# Patient Record
Sex: Female | Born: 1965 | Race: White | Hispanic: No | Marital: Married | Smoking: Never smoker
Health system: Southern US, Community
[De-identification: ages and names within clinical notes are randomized; demographics above are authoritative.]

## PROBLEM LIST (undated history)

## (undated) DIAGNOSIS — I8390 Asymptomatic varicose veins of unspecified lower extremity: Secondary | ICD-10-CM

## (undated) DIAGNOSIS — Z889 Allergy status to unspecified drugs, medicaments and biological substances status: Secondary | ICD-10-CM

## (undated) DIAGNOSIS — IMO0002 Reserved for concepts with insufficient information to code with codable children: Secondary | ICD-10-CM

## (undated) DIAGNOSIS — Z9189 Other specified personal risk factors, not elsewhere classified: Secondary | ICD-10-CM

## (undated) DIAGNOSIS — C50919 Malignant neoplasm of unspecified site of unspecified female breast: Secondary | ICD-10-CM

## (undated) HISTORY — DX: Other specified personal risk factors, not elsewhere classified: Z91.89

## (undated) HISTORY — PX: OTHER SURGICAL HISTORY: SHX169

## (undated) HISTORY — DX: Asymptomatic varicose veins of unspecified lower extremity: I83.90

## (undated) HISTORY — DX: Reserved for concepts with insufficient information to code with codable children: IMO0002

## (undated) HISTORY — DX: Malignant neoplasm of unspecified site of unspecified female breast: C50.919

## (undated) HISTORY — PX: BREAST SURGERY: SHX581

---

## 2001-05-15 ENCOUNTER — Other Ambulatory Visit: Admission: RE | Admit: 2001-05-15 | Discharge: 2001-05-15 | Payer: Self-pay | Admitting: Obstetrics and Gynecology

## 2003-02-22 ENCOUNTER — Other Ambulatory Visit: Admission: RE | Admit: 2003-02-22 | Discharge: 2003-02-22 | Payer: Self-pay | Admitting: Obstetrics and Gynecology

## 2004-08-27 ENCOUNTER — Other Ambulatory Visit: Admission: RE | Admit: 2004-08-27 | Discharge: 2004-08-27 | Payer: Self-pay | Admitting: Obstetrics and Gynecology

## 2005-12-05 ENCOUNTER — Other Ambulatory Visit: Admission: RE | Admit: 2005-12-05 | Discharge: 2005-12-05 | Payer: Self-pay | Admitting: Obstetrics and Gynecology

## 2007-03-10 ENCOUNTER — Other Ambulatory Visit: Admission: RE | Admit: 2007-03-10 | Discharge: 2007-03-10 | Payer: Self-pay | Admitting: Obstetrics and Gynecology

## 2008-03-24 ENCOUNTER — Ambulatory Visit: Payer: Self-pay | Admitting: Obstetrics and Gynecology

## 2008-03-24 ENCOUNTER — Other Ambulatory Visit: Admission: RE | Admit: 2008-03-24 | Discharge: 2008-03-24 | Payer: Self-pay | Admitting: Obstetrics and Gynecology

## 2008-03-24 ENCOUNTER — Encounter: Payer: Self-pay | Admitting: Obstetrics and Gynecology

## 2008-04-15 ENCOUNTER — Ambulatory Visit: Payer: Self-pay | Admitting: Obstetrics and Gynecology

## 2008-06-07 ENCOUNTER — Ambulatory Visit: Payer: Self-pay | Admitting: Obstetrics and Gynecology

## 2008-06-16 ENCOUNTER — Ambulatory Visit (HOSPITAL_COMMUNITY): Admission: RE | Admit: 2008-06-16 | Discharge: 2008-06-16 | Payer: Self-pay | Admitting: Obstetrics and Gynecology

## 2008-06-16 ENCOUNTER — Ambulatory Visit: Payer: Self-pay | Admitting: Obstetrics and Gynecology

## 2009-06-08 ENCOUNTER — Ambulatory Visit: Payer: Self-pay | Admitting: Obstetrics and Gynecology

## 2009-06-08 ENCOUNTER — Other Ambulatory Visit: Admission: RE | Admit: 2009-06-08 | Discharge: 2009-06-08 | Payer: Self-pay | Admitting: Obstetrics and Gynecology

## 2010-07-25 ENCOUNTER — Other Ambulatory Visit
Admission: RE | Admit: 2010-07-25 | Discharge: 2010-07-25 | Payer: Self-pay | Source: Home / Self Care | Admitting: Obstetrics and Gynecology

## 2010-07-25 ENCOUNTER — Other Ambulatory Visit: Payer: Self-pay | Admitting: Obstetrics and Gynecology

## 2010-07-25 ENCOUNTER — Ambulatory Visit
Admission: RE | Admit: 2010-07-25 | Discharge: 2010-07-25 | Payer: Self-pay | Source: Home / Self Care | Attending: Obstetrics and Gynecology | Admitting: Obstetrics and Gynecology

## 2010-11-15 ENCOUNTER — Other Ambulatory Visit (INDEPENDENT_AMBULATORY_CARE_PROVIDER_SITE_OTHER): Payer: BC Managed Care – PPO

## 2010-11-15 DIAGNOSIS — D649 Anemia, unspecified: Secondary | ICD-10-CM

## 2011-01-21 ENCOUNTER — Encounter: Payer: Self-pay | Admitting: Obstetrics and Gynecology

## 2011-01-21 ENCOUNTER — Ambulatory Visit: Payer: BC Managed Care – PPO | Admitting: Obstetrics and Gynecology

## 2011-01-21 ENCOUNTER — Other Ambulatory Visit: Payer: Self-pay

## 2011-01-21 ENCOUNTER — Ambulatory Visit (INDEPENDENT_AMBULATORY_CARE_PROVIDER_SITE_OTHER): Payer: BC Managed Care – PPO | Admitting: Obstetrics and Gynecology

## 2011-01-21 DIAGNOSIS — N920 Excessive and frequent menstruation with regular cycle: Secondary | ICD-10-CM

## 2011-01-21 DIAGNOSIS — N852 Hypertrophy of uterus: Secondary | ICD-10-CM

## 2011-01-21 DIAGNOSIS — D259 Leiomyoma of uterus, unspecified: Secondary | ICD-10-CM

## 2011-01-21 DIAGNOSIS — D251 Intramural leiomyoma of uterus: Secondary | ICD-10-CM

## 2011-01-21 NOTE — Progress Notes (Signed)
The patient came in today for followup ultrasound her fibroids continued to grow. On ultrasound today the her myoma is as large as 12.5 cm. Last visit it was only 11 cm. The the endometrial echo is 5.4 mm. Both her ovaries can be visualized in spite of the large fibroids and she has no ovarian pathology. Her cul-de-sac does have some fluid. The patient now is ready to address her myomas. We discussed medication such as Depo-Lupron Lupron or the day after pill. We discussed ultrasound obstruction or uterine artery embolization. The fibroids calm to the level of her umbilicus I don't believe minimally invasive surgery is possible. We discussed proceeding with abdominal hysterectomy and the patient is agreeable. She will call me with dates.

## 2011-01-22 ENCOUNTER — Encounter: Payer: Self-pay | Admitting: Obstetrics and Gynecology

## 2011-01-29 ENCOUNTER — Telehealth: Payer: Self-pay

## 2011-01-29 NOTE — Telephone Encounter (Signed)
Patient is ready to schedule surgery. We talked about dates and she was hoping to do it next week.  The only date that would be possible with Dr. Reece Agar schedule is 8/7 and I checked with OR and there is no time available. Dr Reece Agar out of office 8/15-8/21 so I told patient we should look toward end of August.  Patient was on lunch hour and will go back to work and look at schedule and call me again this pm.  Meantime, could you give me procedure info.  I am sending traditional surgery slip to you on her chart if you want to just indicate it there.  Thanks.

## 2011-01-29 NOTE — Telephone Encounter (Signed)
Pt Called back after checking her calendar and I scheduled her for Aug 27. 2012 at 7:30am at [facility].  Preop consult scheduled for 8/23 with Dr. Reece Agar.  Dr G notified of date so that he can do his order sets.

## 2011-01-29 NOTE — Telephone Encounter (Signed)
Please schedule TAH. Diagnosis is fibroids.

## 2011-02-20 NOTE — H&P (Signed)
  Chief complaint: Symptomatic fibroids.  History of present illness: Patient is a 45 year old nulligravida who we have watched with fibroids for an extended period of time. Over the past year to 2 years they have continued to grow. They are now up to the patient's umbilicus. On ultrasound the largest one is 12-1/2 cm. The patient is now very symptomatic from them. This includes anemia requiring iron replacement due to heavy periods. The patient is having significant pressure on both her bladder and her rectum. She is comfortable with the decision not to have any  Children. For a while her periods could be controlled with an IUD. However they are now not controlled at all and she has menorrhagia and dysmenorrhea. The fibroids are too large to require minimally invasive surgery. The patient therefore will undergo a total abdominal hysterectomy. We will preserve  Her ovarys unless there is disease. On ultrasound her ovaries appear normal.  Past medical history: Previous surgery for varicose veins in 2000 and 2001. No significant medical illnesses. Patient does have allergies and she takes Benadryl Sudafed for the above. Medications: Sudafed, Benadryl, ibuprofen, and iron. Allergies: No known drug allergies.  Social history: Unremarkable.  Family history: Maternal grandmother had uterine cancer. Mother and father hypertensive. Maternal aunt had breast cancer.  Review of systems: HEENT negative. Cardiac negative. Respiratory negative. GU negative except as noted above. Endocrine negative. Musculoskeletal negative.  Physical exam: Blood pressure 116/72, pulse 80, respirations 16. Physical examination: HEENT within normal limits. Neck: Thyroid not large. No masses. Supraclavicular nodes: not enlarged. Breasts: Examined in both sitting midline position. No skin changes and no masses. Abdomen: Soft no guarding rebound or masses or hernia. Except for large uterus palpable to the umbilicus. Pelvic: External:  Within normal limits. BUS: Within normal limits. Vaginal:within normal limits. Good estrogen effect. No evidence of cystocele rectocele or enterocele. Cervix: clean. Uterus: enlarged 20 week size. Adnexa: Not palpable due to large size of uterus. Rectovaginal exam: Confirmatory and negative. Extremities: Within normal limits.  Assessment: Symptomatic fibroids.  Plan: Exploratory laparotomy with removal of the uterus.

## 2011-02-21 ENCOUNTER — Encounter (HOSPITAL_COMMUNITY): Payer: Self-pay

## 2011-02-21 ENCOUNTER — Encounter (HOSPITAL_COMMUNITY)
Admission: RE | Admit: 2011-02-21 | Discharge: 2011-02-21 | Disposition: A | Discharge status: Home / Self Care | Payer: BC Managed Care – PPO | Source: Ambulatory Visit | Attending: Obstetrics and Gynecology | Admitting: Obstetrics and Gynecology

## 2011-02-21 ENCOUNTER — Ambulatory Visit (INDEPENDENT_AMBULATORY_CARE_PROVIDER_SITE_OTHER): Payer: BC Managed Care – PPO | Admitting: Obstetrics and Gynecology

## 2011-02-21 ENCOUNTER — Encounter: Payer: Self-pay | Admitting: Obstetrics and Gynecology

## 2011-02-21 DIAGNOSIS — D259 Leiomyoma of uterus, unspecified: Secondary | ICD-10-CM

## 2011-02-21 DIAGNOSIS — N920 Excessive and frequent menstruation with regular cycle: Secondary | ICD-10-CM

## 2011-02-21 DIAGNOSIS — D219 Benign neoplasm of connective and other soft tissue, unspecified: Secondary | ICD-10-CM | POA: Insufficient documentation

## 2011-02-21 LAB — CBC
Hemoglobin: 10.2 g/dL — ABNORMAL LOW (ref 12.0–15.0)
MCH: 26.5 pg (ref 26.0–34.0)
MCHC: 31 g/dL (ref 30.0–36.0)
MCV: 85.5 fL (ref 78.0–100.0)
RBC: 3.85 MIL/uL — ABNORMAL LOW (ref 3.87–5.11)

## 2011-02-21 NOTE — Progress Notes (Signed)
The patient came today to discuss her fibroids. We are going to proceed with her surgery next week. We will remove her uterus but not her ovaries unless she has significant disease. She will stop all nonsteroidal drugs now. We discussed normal recuperation convalescence. We discussed her stay in the hospital. We discussed risks of surgery including infection, hemorrhage, wound dehiscence, DVT, and fistula formation. All her questions were answered. We will use a low transverse incision. We will use PCA morphine.

## 2011-02-21 NOTE — Patient Instructions (Signed)
20 RHYANNA SORCE  02/21/2011   Your procedure is scheduled on:  02/25/11  Report to [facility] at 0600 AM.  Call this number if you have problems the morning of surgery: [phone]   Remember:   Do not eat food:After Midnight.  Do not drink clear liquids: After Midnight.  Take these medicines the morning of surgery with A SIP OF WATER: none   Do not wear jewelry, make-up or nail polish.  Do not wear lotions, powders, or perfumes. You may wear deodorant.  Do not shave 48 hours prior to surgery.  Do not bring valuables to the hospital.  Contacts, dentures or bridgework may not be worn into surgery.  Leave suitcase in the car. After surgery it may be brought to your room.  For patients admitted to the hospital, checkout time is 11:00 AM the day of discharge.   Patients discharged the day of surgery will not be allowed to drive home.  Name and phone number of your driver: mother-Hildagard Prajapati- [phone]  Special Instructions: CHG Shower Use Special Wash: 1/2 bottle night before surgery and 1/2 bottle morning of surgery.   Please read over the following fact sheets that you were given: none

## 2011-02-25 ENCOUNTER — Encounter (HOSPITAL_COMMUNITY)
Admission: RE | Disposition: A | Discharge status: Home / Self Care | Payer: Self-pay | Source: Ambulatory Visit | Attending: Obstetrics and Gynecology

## 2011-02-25 ENCOUNTER — Encounter (HOSPITAL_COMMUNITY): Payer: Self-pay | Admitting: Anesthesiology

## 2011-02-25 ENCOUNTER — Other Ambulatory Visit: Payer: Self-pay | Admitting: Obstetrics and Gynecology

## 2011-02-25 ENCOUNTER — Inpatient Hospital Stay (HOSPITAL_COMMUNITY): Payer: BC Managed Care – PPO | Admitting: Anesthesiology

## 2011-02-25 ENCOUNTER — Inpatient Hospital Stay (HOSPITAL_COMMUNITY)
Admission: RE | Admit: 2011-02-25 | Discharge: 2011-02-27 | DRG: 358 | Disposition: A | Discharge status: Home / Self Care | Payer: BC Managed Care – PPO | Source: Ambulatory Visit | Attending: Obstetrics and Gynecology | Admitting: Obstetrics and Gynecology

## 2011-02-25 ENCOUNTER — Encounter (HOSPITAL_COMMUNITY): Payer: Self-pay | Admitting: *Deleted

## 2011-02-25 DIAGNOSIS — N8000 Endometriosis of the uterus, unspecified: Secondary | ICD-10-CM | POA: Diagnosis present

## 2011-02-25 DIAGNOSIS — N920 Excessive and frequent menstruation with regular cycle: Secondary | ICD-10-CM

## 2011-02-25 DIAGNOSIS — K929 Disease of digestive system, unspecified: Secondary | ICD-10-CM | POA: Diagnosis not present

## 2011-02-25 DIAGNOSIS — N946 Dysmenorrhea, unspecified: Secondary | ICD-10-CM | POA: Diagnosis present

## 2011-02-25 DIAGNOSIS — D251 Intramural leiomyoma of uterus: Secondary | ICD-10-CM | POA: Diagnosis present

## 2011-02-25 DIAGNOSIS — D259 Leiomyoma of uterus, unspecified: Secondary | ICD-10-CM

## 2011-02-25 DIAGNOSIS — D500 Iron deficiency anemia secondary to blood loss (chronic): Secondary | ICD-10-CM | POA: Diagnosis present

## 2011-02-25 DIAGNOSIS — K560 Paralytic ileus: Secondary | ICD-10-CM | POA: Diagnosis not present

## 2011-02-25 HISTORY — PX: ABDOMINAL HYSTERECTOMY: SHX81

## 2011-02-25 SURGERY — HYSTERECTOMY, ABDOMINAL
Anesthesia: General | Site: Abdomen | Wound class: Clean Contaminated

## 2011-02-25 MED ORDER — ROCURONIUM BROMIDE 50 MG/5ML IV SOLN
INTRAVENOUS | Status: AC
  Filled 2011-02-25: qty 1

## 2011-02-25 MED ORDER — KETOROLAC TROMETHAMINE 30 MG/ML IJ SOLN
INTRAMUSCULAR | Status: AC
  Filled 2011-02-25: qty 1

## 2011-02-25 MED ORDER — FENTANYL CITRATE 0.05 MG/ML IJ SOLN
INTRAMUSCULAR | Status: AC
  Filled 2011-02-25: qty 10

## 2011-02-25 MED ORDER — GLYCOPYRROLATE 0.2 MG/ML IJ SOLN
INTRAMUSCULAR | Status: DC | PRN
  Administered 2011-02-25: .4 mg via INTRAVENOUS

## 2011-02-25 MED ORDER — FENTANYL CITRATE 0.05 MG/ML IJ SOLN
INTRAMUSCULAR | Status: AC
  Administered 2011-02-25: 50 ug via INTRAVENOUS
  Filled 2011-02-25: qty 2

## 2011-02-25 MED ORDER — BISACODYL 10 MG RE SUPP
10.0000 mg | Freq: Every day | RECTAL | Status: DC | PRN
  Administered 2011-02-26 (×2): 10 mg via RECTAL
  Filled 2011-02-25 (×2): qty 1

## 2011-02-25 MED ORDER — LACTATED RINGERS IV SOLN
INTRAVENOUS | Status: DC
  Administered 2011-02-25: 06:00:00 via INTRAVENOUS

## 2011-02-25 MED ORDER — ONDANSETRON HCL 4 MG/2ML IJ SOLN
INTRAMUSCULAR | Status: AC
  Filled 2011-02-25: qty 2

## 2011-02-25 MED ORDER — DEXTROSE 5 % IV SOLN
INTRAVENOUS | Status: DC | PRN
  Administered 2011-02-25: 07:00:00 via INTRAVENOUS

## 2011-02-25 MED ORDER — TEMAZEPAM 15 MG PO CAPS: 15.0000 mg | ORAL_CAPSULE | Freq: Every evening | ORAL | Status: DC | PRN

## 2011-02-25 MED ORDER — NEOSTIGMINE METHYLSULFATE 1 MG/ML IJ SOLN
INTRAMUSCULAR | Status: DC | PRN
  Administered 2011-02-25: 3 mg via INTRAMUSCULAR

## 2011-02-25 MED ORDER — LIDOCAINE HCL (CARDIAC) 20 MG/ML IV SOLN
INTRAVENOUS | Status: AC
  Filled 2011-02-25: qty 5

## 2011-02-25 MED ORDER — OXYCODONE-ACETAMINOPHEN 5-325 MG PO TABS
1.0000 | ORAL_TABLET | ORAL | Status: DC | PRN
  Administered 2011-02-26: 2 via ORAL
  Administered 2011-02-26: 1 via ORAL
  Filled 2011-02-25: qty 1
  Filled 2011-02-25: qty 2

## 2011-02-25 MED ORDER — MIDAZOLAM HCL 2 MG/2ML IJ SOLN
INTRAMUSCULAR | Status: AC
  Filled 2011-02-25: qty 4

## 2011-02-25 MED ORDER — KETOROLAC TROMETHAMINE 30 MG/ML IJ SOLN
INTRAMUSCULAR | Status: DC | PRN
  Administered 2011-02-25: 30 mg via INTRAVENOUS

## 2011-02-25 MED ORDER — DIPHENHYDRAMINE HCL 50 MG/ML IJ SOLN: 12.5000 mg | Freq: Four times a day (QID) | INTRAMUSCULAR | Status: DC | PRN

## 2011-02-25 MED ORDER — PROPOFOL 10 MG/ML IV EMUL
INTRAVENOUS | Status: DC | PRN
  Administered 2011-02-25: 120 mg via INTRAVENOUS

## 2011-02-25 MED ORDER — NALOXONE HCL 0.4 MG/ML IJ SOLN: 0.4000 mg | INTRAMUSCULAR | Status: DC | PRN

## 2011-02-25 MED ORDER — FENTANYL CITRATE 0.05 MG/ML IJ SOLN
25.0000 ug | INTRAMUSCULAR | Status: DC | PRN
  Administered 2011-02-25: 25 ug via INTRAVENOUS
  Administered 2011-02-25: 50 ug via INTRAVENOUS

## 2011-02-25 MED ORDER — ONDANSETRON HCL 4 MG/2ML IJ SOLN
4.0000 mg | Freq: Four times a day (QID) | INTRAMUSCULAR | Status: DC | PRN
  Administered 2011-02-25: 4 mg via INTRAVENOUS

## 2011-02-25 MED ORDER — DEXAMETHASONE SODIUM PHOSPHATE 4 MG/ML IJ SOLN
INTRAMUSCULAR | Status: DC | PRN
  Administered 2011-02-25 (×2): 5 mg via INTRAVENOUS

## 2011-02-25 MED ORDER — KETOROLAC TROMETHAMINE 30 MG/ML IJ SOLN: 30.0000 mg | Freq: Three times a day (TID) | INTRAMUSCULAR | Status: DC

## 2011-02-25 MED ORDER — LIDOCAINE HCL (CARDIAC) 20 MG/ML IV SOLN
INTRAVENOUS | Status: DC | PRN
  Administered 2011-02-25: 50 mg via INTRAVENOUS

## 2011-02-25 MED ORDER — MENTHOL 3 MG MT LOZG: 1.0000 | LOZENGE | OROMUCOSAL | Status: DC | PRN

## 2011-02-25 MED ORDER — ONDANSETRON HCL 4 MG/2ML IJ SOLN
4.0000 mg | Freq: Four times a day (QID) | INTRAMUSCULAR | Status: DC | PRN
  Filled 2011-02-25: qty 2

## 2011-02-25 MED ORDER — CEFAZOLIN SODIUM 1-5 GM-% IV SOLN
1.0000 g | INTRAVENOUS | Status: AC
  Administered 2011-02-25: 1 g via INTRAVENOUS

## 2011-02-25 MED ORDER — PROPOFOL 10 MG/ML IV EMUL
INTRAVENOUS | Status: AC
  Filled 2011-02-25: qty 20

## 2011-02-25 MED ORDER — SODIUM CHLORIDE 0.9 % IJ SOLN: 9.0000 mL | INTRAMUSCULAR | Status: DC | PRN

## 2011-02-25 MED ORDER — GLYCOPYRROLATE 0.2 MG/ML IJ SOLN
INTRAMUSCULAR | Status: AC
  Filled 2011-02-25: qty 2

## 2011-02-25 MED ORDER — FENTANYL CITRATE 0.05 MG/ML IJ SOLN
INTRAMUSCULAR | Status: DC | PRN
  Administered 2011-02-25 (×3): 100 ug via INTRAVENOUS
  Administered 2011-02-25 (×2): 50 ug via INTRAVENOUS
  Administered 2011-02-25: 100 ug via INTRAVENOUS

## 2011-02-25 MED ORDER — DIPHENHYDRAMINE HCL 12.5 MG/5ML PO ELIX: 12.5000 mg | ORAL_SOLUTION | Freq: Four times a day (QID) | ORAL | Status: DC | PRN

## 2011-02-25 MED ORDER — ACETAMINOPHEN 325 MG PO TABS: 325.0000 mg | ORAL_TABLET | ORAL | Status: DC | PRN

## 2011-02-25 MED ORDER — MIDAZOLAM HCL 5 MG/5ML IJ SOLN
INTRAMUSCULAR | Status: DC | PRN
  Administered 2011-02-25: 1.5 mg via INTRAVENOUS
  Administered 2011-02-25: .5 mg via INTRAVENOUS

## 2011-02-25 MED ORDER — ROCURONIUM BROMIDE 100 MG/10ML IV SOLN
INTRAVENOUS | Status: DC | PRN
  Administered 2011-02-25: 30 mg via INTRAVENOUS

## 2011-02-25 MED ORDER — DEXTROSE-NACL 5-0.45 % IV SOLN
INTRAVENOUS | Status: DC
  Administered 2011-02-25 – 2011-02-26 (×3): via INTRAVENOUS

## 2011-02-25 MED ORDER — ONDANSETRON HCL 4 MG/2ML IJ SOLN
INTRAMUSCULAR | Status: DC | PRN
  Administered 2011-02-25 (×2): 2 mg via INTRAVENOUS

## 2011-02-25 MED ORDER — SODIUM CHLORIDE 0.9 % IR SOLN
Status: DC | PRN
  Administered 2011-02-25: 1000 mL

## 2011-02-25 MED ORDER — CEFAZOLIN SODIUM 1-5 GM-% IV SOLN
INTRAVENOUS | Status: AC
  Filled 2011-02-25: qty 50

## 2011-02-25 MED ORDER — KETOROLAC TROMETHAMINE 30 MG/ML IJ SOLN: 15.0000 mg | Freq: Once | INTRAMUSCULAR | Status: DC | PRN

## 2011-02-25 MED ORDER — ONDANSETRON HCL 4 MG PO TABS: 4.0000 mg | ORAL_TABLET | Freq: Four times a day (QID) | ORAL | Status: DC | PRN

## 2011-02-25 MED ORDER — PROMETHAZINE HCL 25 MG/ML IJ SOLN: 6.2500 mg | INTRAMUSCULAR | Status: DC | PRN

## 2011-02-25 MED ORDER — HYDROMORPHONE HCL 1 MG/ML IJ SOLN: 0.2500 mg | INTRAMUSCULAR | Status: DC | PRN

## 2011-02-25 MED ORDER — MORPHINE SULFATE (PF) 1 MG/ML IV SOLN
INTRAVENOUS | Status: DC
  Administered 2011-02-25: 21 mg via INTRAVENOUS
  Administered 2011-02-25: 25 mg via INTRAVENOUS
  Administered 2011-02-25: 3 mg via INTRAVENOUS
  Administered 2011-02-25: 25 mg via INTRAVENOUS
  Administered 2011-02-25: 1.5 mg via INTRAVENOUS
  Administered 2011-02-26: 4.5 mg via INTRAVENOUS
  Administered 2011-02-26: 6 mg via INTRAVENOUS
  Filled 2011-02-25 (×2): qty 25

## 2011-02-25 MED ORDER — DEXAMETHASONE SODIUM PHOSPHATE 10 MG/ML IJ SOLN
INTRAMUSCULAR | Status: AC
  Filled 2011-02-25: qty 1

## 2011-02-25 SURGICAL SUPPLY — 30 items
BARRIER ADHS 3X4 INTERCEED (GAUZE/BANDAGES/DRESSINGS) IMPLANT
BRR ADH 4X3 ABS CNTRL BYND (GAUZE/BANDAGES/DRESSINGS)
CANISTER SUCTION 2500CC (MISCELLANEOUS) ×2 IMPLANT
CLOTH BEACON ORANGE TIMEOUT ST (SAFETY) ×2 IMPLANT
DRAPE UTILITY XL STRL (DRAPES) ×2 IMPLANT
DRESSING TELFA 8X3 (GAUZE/BANDAGES/DRESSINGS) ×1 IMPLANT
DRSG COVADERM 4X8 (GAUZE/BANDAGES/DRESSINGS) ×1 IMPLANT
DRSG PAD ABDOMINAL 8X10 ST (GAUZE/BANDAGES/DRESSINGS) ×1 IMPLANT
GLOVE BIOGEL PI IND STRL 7.5 (GLOVE) ×2 IMPLANT
GLOVE BIOGEL PI INDICATOR 7.5 (GLOVE) ×2
GLOVE ECLIPSE 7.0 STRL STRAW (GLOVE) ×3 IMPLANT
GOWN PREVENTION PLUS LG XLONG (DISPOSABLE) ×6 IMPLANT
NS IRRIG 1000ML POUR BTL (IV SOLUTION) ×2 IMPLANT
PACK ABDOMINAL GYN (CUSTOM PROCEDURE TRAY) ×2 IMPLANT
PAD OB MATERNITY 4.3X12.25 (PERSONAL CARE ITEMS) ×2 IMPLANT
SPONGE LAP 18X18 X RAY DECT (DISPOSABLE) ×2 IMPLANT
STAPLER VISISTAT 35W (STAPLE) ×1 IMPLANT
STRIP CLOSURE SKIN 1/2X4 (GAUZE/BANDAGES/DRESSINGS) ×1 IMPLANT
SUT CHROMIC 1 TIES 18 (SUTURE) ×2 IMPLANT
SUT CHROMIC 1MO 4 18 CR8 (SUTURE) ×6 IMPLANT
SUT CHROMIC 2 0 SH (SUTURE) IMPLANT
SUT CHROMIC 3 0 SH 27 (SUTURE) IMPLANT
SUT PLAIN 3 0 FS2 (SUTURE) ×1 IMPLANT
SUT VIC AB 0 CT1 36 (SUTURE) ×5 IMPLANT
SUT VIC AB 3-0 SH 27 (SUTURE) ×2
SUT VIC AB 3-0 SH 27X BRD (SUTURE) IMPLANT
TAPE CLOTH SURG 4X10 WHT LF (GAUZE/BANDAGES/DRESSINGS) ×1 IMPLANT
TOWEL OR 17X24 6PK STRL BLUE (TOWEL DISPOSABLE) ×4 IMPLANT
TRAY FOLEY CATH 14FR (SET/KITS/TRAYS/PACK) ×2 IMPLANT
WATER STERILE IRR 1000ML POUR (IV SOLUTION) ×2 IMPLANT

## 2011-02-25 NOTE — Brief Op Note (Signed)
02/25/2011  8:58 AM  PATIENT:  Evelyn Castro  45 y.o. 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 endotracheal  ESTIMATED BLOOD LOSS: 450 cc.  BLOOD ADMINISTERED:none  DRAINS: none  LOCAL MEDICATIONS USED:  NONE  SPECIMEN:  Uterus and cervix and IUD.  DISPOSITION OF SPECIMEN:  pathology.  COUNTS:  2 sponge needle and instrument counts correct.  TOURNIQUET:  Not used.  DICTATION #: none  PLAN OF CARE: routine  PATIENT DISPOSITION:  PACU   Delay start of Pharmacological VTE agent (>24hrs) due to surgical blood loss or risk of bleeding:  no       Operative note:  Preoperative diagnoses: Fibroids with menorrhagia and dysmenorrhea.  Postoperative diagnosis: Same plus endometriosis.  Date of procedure: 02/25/2011.  Surgeon: Dr. Eda Paschal  First Asst.: Dr. Audie Box  Operation: Total abdominal hysterectomy.  Findings: At the time of laparotomy large fibroids were encountered. Total size of the uterus was over 900 g. On the posterior serosal surface there were 2 areas of endometriosis. They were pigmented and accounted for 3-4 cm. Ovaries, fallopian tubes, and rest of peritoneum was free of disease. Ileocecal junction was identified and the appendix was normal.  Procedure: After general endotracheal anesthesia the patient was placed in the supine position. She was prepped and draped in usual sterile manner. A Foley catheter was inserted into her bladder. A Pfannenstiel incision was made. Subcutaneous bleeders were clamped and bovied as encountered. The fascia was opened transversely. The peritoneum was entered vertically. The uterus was delivered in order to do the surgery. The  utero-ovarian ligaments round ligaments and fallopian tubes were clamped cut and suture ligated. Suture material for all the uterine pedicles was #1 chromic catgut. The vesicouterine fold of peritoneum were sharply incised. The posterior peritoneum was sharply incised. The uterine arteries were clamped ,cut,  and doubly suture-ligated. The fundus was then amputated to facilitate removal of the cervix. The parametrium was then taken down in successive bites by clamping, cutting, and suturing. The cervical vaginal junction was entered and trimmed around. The uterus including the cervix and her IUD was sent to pathology for tissue diagnosis. The vaginal cuff was sutured to the parametrium and uterosacral ligaments for good vault support. The cuff was closed with interrupted 0 Vicryl. There were some oozing that was controlled with the Bovie current. Because the entire case was somewhat oozy Surgicel was left at the cuff. 2 sponge, needle, and instrument counts were correct. The peritoneum was closed with a running 2-0 Vicryl. Copious irrigation had been done in the pelvis. It was also repeated in the sub and supra fascial spaces. The fascia was closed with 2 running 0 Vicryl. The skin was closed with a subcuticular 30 plain. Estimated blood loss for entire procedure was 450 cc. A sterile pressure dressing was placed. The patient left the operating room in satisfactory condition. She was draining clear urine from her Foley catheter.

## 2011-02-25 NOTE — Preoperative (Signed)
Beta Blockers   Reason not to administer Beta Blockers:Not Applicable 

## 2011-02-25 NOTE — H&P (Signed)
  There has been no change in history or physical exam since dictated H and P.

## 2011-02-25 NOTE — Progress Notes (Signed)
Encounter addended by: Madison Hickman on: 02/25/2011  6:16 PM<BR>     Documentation filed: Notes Section

## 2011-02-25 NOTE — Anesthesia Preprocedure Evaluation (Signed)
Anesthesia Evaluation  Name, MR# and DOB Patient awake  General Assessment Comment  Reviewed: Allergy & Precautions, H&P , Patient's Chart, lab work & pertinent test results, reviewed documented beta blocker date and time   History of Anesthesia Complications Negative for: history of anesthetic complications  Airway Mallampati: II TM Distance: >3 FB Neck ROM: full    Dental No notable dental hx.    Pulmonary  clear to auscultation  pulmonary exam normalPulmonary Exam Normal breath sounds clear to auscultation none    Cardiovascular Exercise Tolerance: Good regular Normal    Neuro/Psych Negative Neurological ROS  Negative Psych ROS  GI/Hepatic/Renal negative GI ROS  negative Liver ROS  negative Renal ROS        Endo/Other  Negative Endocrine ROS (+)      Abdominal   Musculoskeletal   Hematology negative hematology ROS (+)   Peds  Reproductive/Obstetrics negative OB ROS    Anesthesia Other Findings             Anesthesia Physical Anesthesia Plan  ASA: I  Anesthesia Plan: General   Post-op Pain Management:    Induction:   Airway Management Planned: Oral ETT  Additional Equipment:   Intra-op Plan:   Post-operative Plan:   Informed Consent: I have reviewed the patients History and Physical, chart, labs and discussed the procedure including the risks, benefits and alternatives for the proposed anesthesia with the patient or authorized representative who has indicated his/her understanding and acceptance.   Dental Advisory Given  Plan Discussed with: CRNA and Surgeon  Anesthesia Plan Comments:         Anesthesia Quick Evaluation

## 2011-02-25 NOTE — Progress Notes (Signed)
UR chart review completed.  

## 2011-02-25 NOTE — Anesthesia Postprocedure Evaluation (Signed)
Anesthesia Post Note  Patient: Evelyn Castro  Procedure(s) Performed:  HYSTERECTOMY ABDOMINAL  Anesthesia type: General  Patient location: PACU  Post pain: Pain level controlled  Post assessment: Post-op Vital signs reviewed  Last Vitals:  Filed Vitals:   02/25/11 0945  BP: 117/59  Pulse: 67  Temp:   Resp: 16    Post vital signs: Reviewed  Level of consciousness: sedated  Complications: No apparent anesthesia complicationsfj

## 2011-02-25 NOTE — Op Note (Signed)
Please note the complete operative note was dictated and signed in the brief operative note space.

## 2011-02-25 NOTE — Anesthesia Postprocedure Evaluation (Signed)
  Anesthesia Post-op Note  Patient: Evelyn Castro  Procedure(s) Performed:  HYSTERECTOMY ABDOMINAL  Patient Location: PACU and Women's Unit  Anesthesia Type: General  Level of Consciousness: awake, alert  and oriented  Airway and Oxygen Therapy: Patient Spontanous Breathing  Post-op Pain: none  Post-op Assessment: Post-op Vital signs reviewed and Patient's Cardiovascular Status Stable  Post-op Vital Signs: Reviewed and stable  Complications: No apparent anesthesia complications

## 2011-02-25 NOTE — Transfer of Care (Signed)
  Anesthesia Post-op Note  Patient: Evelyn Castro  Procedure(s) Performed:  HYSTERECTOMY ABDOMINAL  Patient Location: PACU  Anesthesia Type: General  Level of Consciousness: awake, alert  and oriented  Airway and Oxygen Therapy: Patient Spontanous Breathing and Patient connected to nasal cannula oxygen  Post-op Pain: 2 /10  Post-op Assessment: Post-op Vital signs reviewed, Patient's Cardiovascular Status Stable, Respiratory Function Stable and Patent Airway  Post-op Vital Signs: Reviewed and stable  Complications: No apparent anesthesia complications

## 2011-02-26 MED ORDER — MAGNESIUM HYDROXIDE 400 MG/5ML PO SUSP
30.0000 mL | ORAL | Status: AC
  Administered 2011-02-26: 30 mL via ORAL
  Filled 2011-02-26: qty 30

## 2011-02-26 MED ORDER — DOCUSATE SODIUM 100 MG PO CAPS
100.0000 mg | ORAL_CAPSULE | Freq: Every day | ORAL | Status: DC
  Administered 2011-02-26: 100 mg via ORAL
  Filled 2011-02-26: qty 1

## 2011-02-26 MED ORDER — BISACODYL 10 MG RE SUPP: 10.0000 mg | Freq: Once | RECTAL | Status: DC

## 2011-02-26 MED ORDER — MAGNESIUM HYDROXIDE 400 MG/5ML PO SUSP: 30.0000 mL | Freq: Every evening | ORAL | Status: DC | PRN

## 2011-02-26 MED ORDER — DEXTROSE 5 % IN LACTATED RINGERS IV BOLUS: 1000.0000 mL | Freq: Once | INTRAVENOUS | Status: DC

## 2011-02-26 NOTE — Progress Notes (Signed)
Subjective: Patient reports   No nausea, tolerating po well. Less pain  Objective: I have reviewed patient's .vital signs, i and o  abdomen soft, incision clean, no bleeding   Assessment/Plan:doing well, increase diet, foley removed  LOS: 1 day    Evelyn Castro L 02/26/2011, 8:12 AM

## 2011-02-27 MED ORDER — FERROUS SULFATE 325 (65 FE) MG PO TABS: 325.0000 mg | ORAL_TABLET | Freq: Two times a day (BID) | ORAL | Status: DC

## 2011-02-27 MED ORDER — IBUPROFEN 800 MG PO TABS
800.0000 mg | ORAL_TABLET | Freq: Four times a day (QID) | ORAL | Status: DC | PRN
  Administered 2011-02-27: 800 mg via ORAL
  Filled 2011-02-27: qty 1

## 2011-02-27 MED ORDER — OXYCODONE-ACETAMINOPHEN 5-325 MG PO TABS: 1.0000 | ORAL_TABLET | ORAL | Status: AC | PRN

## 2011-02-27 NOTE — Progress Notes (Signed)
  Postoperative day 2: Patient voices no complaints. She is voiding well. She is passing gas. She is tolerating a by mouth diet.  Exam: Patient is afebrile. Abdomen is soft without guarding rebound or masses. Incision is healing well.  I and O is excellent.  Patient to be discharged today. Discharge instructions given. She will return to our office in 4 weeks.

## 2011-02-27 NOTE — Discharge Summary (Signed)
  Discharge summary: Date of admission: 02/25/2011 Date of discharge: 02/27/2011  Patient was admitted with symptomatic fibroids for definitive surgery. On the day of admission she was taken to the operating room. A total abdominal hysterectomy was performed. Postoperatively she had a mild ileus. By the second postoperative day this had resolved. Her second postoperative day she was voiding well. By the second postoperative day she was also passing gas. She was also tolerating a by mouth diet. She therefore was discharged.  Pathology report: Uterus showed 11 cm fibroid. Total weight of uterus was overnight 900 g. Uterus also had several areas of endometriosis.  Discharge medications: Percocet, ferrous sulfate twice a day.  Discharge diagnoses: Symptomatic fibroid. Endometriosis. Severe dysmenorrhea and menorrhagia. Anemia due to menorrhagia.  Discharge condition: Improved  Laboratory results: Preoperative CBC showed a hemoglobin of 10.2. Preoperative urinalysis was normal. No postoperative lab was ordered.

## 2011-03-05 ENCOUNTER — Encounter: Payer: Self-pay | Admitting: Obstetrics and Gynecology

## 2011-03-08 ENCOUNTER — Telehealth: Payer: Self-pay

## 2011-03-08 NOTE — Telephone Encounter (Signed)
Left patient a messg on her home recorder telling her Dr. Reece Agar said fine to return to work on the 24th as long as she felt good and felt like going back to work.  Call me if any questions.

## 2011-03-08 NOTE — Telephone Encounter (Signed)
Patient had surgery Monday, Feb 25, 2011.  She called to schedule preop appt but earliest she can get appt  is Weds., 03/27/11.  (You are off 9/14-9/23).  She would like to return to work on Monday, Sept 24. She is asked if you were okay with her returning to work then or did she have to wait until she comes in to see you and you clear her?

## 2011-03-08 NOTE — Telephone Encounter (Signed)
If she is doing well I am fine with her return to work then. (sept 24)

## 2011-03-12 ENCOUNTER — Encounter (HOSPITAL_COMMUNITY): Payer: Self-pay | Admitting: Obstetrics and Gynecology

## 2011-03-20 ENCOUNTER — Encounter: Payer: Self-pay | Admitting: Obstetrics and Gynecology

## 2011-03-27 ENCOUNTER — Encounter: Payer: Self-pay | Admitting: Obstetrics and Gynecology

## 2011-03-27 ENCOUNTER — Ambulatory Visit (INDEPENDENT_AMBULATORY_CARE_PROVIDER_SITE_OTHER): Payer: BC Managed Care – PPO | Admitting: Obstetrics and Gynecology

## 2011-03-27 DIAGNOSIS — D259 Leiomyoma of uterus, unspecified: Secondary | ICD-10-CM

## 2011-03-27 DIAGNOSIS — N809 Endometriosis, unspecified: Secondary | ICD-10-CM

## 2011-03-27 DIAGNOSIS — D219 Benign neoplasm of connective and other soft tissue, unspecified: Secondary | ICD-10-CM

## 2011-03-27 NOTE — Progress Notes (Signed)
Patient came back today for weeks after hysterectomy. Findings were a large fibroids and endometriosis. She has done well with no problems. She was severely anemic and so is been taking her iron twice a day.  Abdomen is soft and her incision is healing well.  External and vaginal within normal limits. Vaginal cuff is healing well. Bimanual exam is normal.  Plan: RTO 4 weeks, check CBC then.

## 2011-04-25 ENCOUNTER — Ambulatory Visit (INDEPENDENT_AMBULATORY_CARE_PROVIDER_SITE_OTHER): Payer: BC Managed Care – PPO | Admitting: Obstetrics and Gynecology

## 2011-04-25 DIAGNOSIS — D259 Leiomyoma of uterus, unspecified: Secondary | ICD-10-CM

## 2011-04-25 DIAGNOSIS — D219 Benign neoplasm of connective and other soft tissue, unspecified: Secondary | ICD-10-CM

## 2011-04-25 DIAGNOSIS — D649 Anemia, unspecified: Secondary | ICD-10-CM

## 2011-04-25 NOTE — Progress Notes (Signed)
Patient came back to see me today for a postoperative visit. She is feeling fine and is still on her iron. She has no symptoms.  Physical exam: Kennon Portela present. Abdomen is soft without guarding rebound or masses. Incision is well-healed. Vaginal exam: All normal with cuff well-healed.  Assessment: Normal postoperative course  Plan: Annual exam and mammogram in January. CBC drawn. I told the patient that if it's normal she will not hear from me and she can stop her iron.

## 2011-08-27 ENCOUNTER — Encounter: Payer: Self-pay | Admitting: Obstetrics and Gynecology

## 2011-09-17 ENCOUNTER — Ambulatory Visit (INDEPENDENT_AMBULATORY_CARE_PROVIDER_SITE_OTHER): Payer: BC Managed Care – PPO | Admitting: Obstetrics and Gynecology

## 2011-09-17 ENCOUNTER — Encounter: Payer: Self-pay | Admitting: Obstetrics and Gynecology

## 2011-09-17 VITALS — BP 116/72 | Ht 60.0 in | Wt 128.0 lb

## 2011-09-17 DIAGNOSIS — N809 Endometriosis, unspecified: Secondary | ICD-10-CM | POA: Insufficient documentation

## 2011-09-17 DIAGNOSIS — Z01419 Encounter for gynecological examination (general) (routine) without abnormal findings: Secondary | ICD-10-CM

## 2011-09-17 DIAGNOSIS — I8390 Asymptomatic varicose veins of unspecified lower extremity: Secondary | ICD-10-CM | POA: Insufficient documentation

## 2011-09-17 LAB — CBC WITH DIFFERENTIAL/PLATELET
Basophils Absolute: 0 10*3/uL (ref 0.0–0.1)
Lymphocytes Relative: 20 % (ref 12–46)
Lymphs Abs: 0.9 10*3/uL (ref 0.7–4.0)
Neutro Abs: 3.3 10*3/uL (ref 1.7–7.7)
Neutrophils Relative %: 71 % (ref 43–77)
Platelets: 254 10*3/uL (ref 150–400)
RBC: 4.4 MIL/uL (ref 3.87–5.11)
RDW: 13.3 % (ref 11.5–15.5)
WBC: 4.7 10*3/uL (ref 4.0–10.5)

## 2011-09-17 NOTE — Progress Notes (Signed)
Patient came to see me today for her annual GYN exam. Since her surgery she's been completely asymptomatic and very happy. She's had no vaginal spotting. She is having no pelvic pain. She is up-to-date on mammograms. She had a  normal lipid profile last year.  HEENT: Within normal limits. Kennon Portela present. Neck: No masses. Supraclavicular lymph nodes: Not enlarged. Breasts: Examined in both sitting and lying position. Symmetrical without skin changes or masses. Abdomen: Soft no masses guarding or rebound. No hernias. Pelvic: External within normal limits. BUS within normal limits. Vaginal examination shows good estrogen effect, no cystocele enterocele or rectocele. Patient has several millimeters of granulation tissue still present. Cervix and uterus absent. Adnexa within normal limits. Rectovaginal confirmatory. Extremities within normal limits.  Assessment: Endometriosis and fibroids. Granulation tissue.  Plan: Silver nitrate to granulation tissue. Yearly mammogram.

## 2011-09-18 LAB — URINALYSIS W MICROSCOPIC + REFLEX CULTURE
Bacteria, UA: NONE SEEN
Casts: NONE SEEN
Glucose, UA: NEGATIVE mg/dL
Hgb urine dipstick: NEGATIVE
Ketones, ur: NEGATIVE mg/dL
Protein, ur: NEGATIVE mg/dL
pH: 5.5 (ref 5.0–8.0)

## 2012-07-01 DIAGNOSIS — C50919 Malignant neoplasm of unspecified site of unspecified female breast: Secondary | ICD-10-CM

## 2012-07-01 HISTORY — DX: Malignant neoplasm of unspecified site of unspecified female breast: C50.919

## 2012-10-27 ENCOUNTER — Encounter: Payer: Self-pay | Admitting: Gynecology

## 2012-10-29 ENCOUNTER — Other Ambulatory Visit: Payer: Self-pay | Admitting: *Deleted

## 2012-10-29 DIAGNOSIS — R928 Other abnormal and inconclusive findings on diagnostic imaging of breast: Secondary | ICD-10-CM

## 2012-10-30 ENCOUNTER — Other Ambulatory Visit: Payer: Self-pay | Admitting: *Deleted

## 2012-10-30 DIAGNOSIS — R928 Other abnormal and inconclusive findings on diagnostic imaging of breast: Secondary | ICD-10-CM

## 2012-11-04 ENCOUNTER — Other Ambulatory Visit: Payer: Self-pay | Admitting: Radiology

## 2012-11-06 ENCOUNTER — Encounter: Payer: Self-pay | Admitting: Gynecology

## 2012-11-06 ENCOUNTER — Other Ambulatory Visit: Payer: Self-pay | Admitting: *Deleted

## 2012-11-06 ENCOUNTER — Other Ambulatory Visit: Payer: Self-pay

## 2012-11-06 DIAGNOSIS — R928 Other abnormal and inconclusive findings on diagnostic imaging of breast: Secondary | ICD-10-CM

## 2012-11-09 ENCOUNTER — Ambulatory Visit (INDEPENDENT_AMBULATORY_CARE_PROVIDER_SITE_OTHER): Payer: BC Managed Care – PPO | Admitting: General Surgery

## 2012-11-09 ENCOUNTER — Encounter (INDEPENDENT_AMBULATORY_CARE_PROVIDER_SITE_OTHER): Payer: Self-pay | Admitting: General Surgery

## 2012-11-09 ENCOUNTER — Encounter (HOSPITAL_BASED_OUTPATIENT_CLINIC_OR_DEPARTMENT_OTHER): Payer: Self-pay | Admitting: *Deleted

## 2012-11-09 VITALS — BP 116/60 | HR 78 | Temp 98.0°F | Resp 18 | Ht 60.0 in | Wt 127.0 lb

## 2012-11-09 DIAGNOSIS — D0590 Unspecified type of carcinoma in situ of unspecified breast: Secondary | ICD-10-CM

## 2012-11-09 DIAGNOSIS — D0501 Lobular carcinoma in situ of right breast: Secondary | ICD-10-CM

## 2012-11-09 NOTE — Progress Notes (Signed)
Patient ID: Evelyn Castro, female   DOB: 07/27/1965, 46 y.o.   MRN: [id number]  Chief Complaint  Patient presents with  . New Evaluation    Abd.norm breast     HPI Evelyn Castro is a 46 y.o. female.  Referred by Dr Cornella HPI 46 yof who is otherwise healthy presents after screening mm showing right breast calcifications. She also has left breast cysts seen on u/s.  She has no complaints referable to either breast.  She underwent core biopsy with finding of LCIS.  She comes in today to discuss options. She does have fh in ma, great aunt of breast cancer and uterine cancer.  Past Medical History  Diagnosis Date  . No pertinent past medical history   . Fibroid   . Varicose veins   . Endometriosis   . Multiple allergies   . Medical history non-contributory     Past Surgical History  Procedure Laterality Date  . Varicose veins  2000, 2001  . Total abdominal hysterectomy  8.27.12    DR.TF ASSISTED  . Abdominal hysterectomy  02/25/2011    Procedure: HYSTERECTOMY ABDOMINAL;  Surgeon: Daniel L Gottsegen, MD;  Location: [facility];  Service: Gynecology;  Laterality: N/A;    Family History  Problem Relation Age of Onset  . Hypertension Mother   . Arthritis Mother   . Hypertension Father   . Breast cancer Maternal Aunt     age 70  . Uterine cancer Maternal Grandmother   . Cancer Maternal Grandmother     died age 45    Social History History  Substance Use Topics  . Smoking status: Never Smoker   . Smokeless tobacco: Not on file  . Alcohol Use: 3.5 oz/week    7 drink(s) per week     Comment: sometimes daily    No Known Allergies  Current Outpatient Prescriptions  Medication Sig Dispense Refill  . DiphenhydrAMINE HCl (BENADRYL PO) Take 25 mg by mouth daily as needed. For allergy symptoms       . ibuprofen (ADVIL,MOTRIN) 200 MG tablet Take 200 mg by mouth 4 (four) times daily as needed. For menstrual cramps      . Multiple Vitamin (MULTIVITAMIN) capsule Take 1 capsule  by mouth daily.       . triamcinolone (NASACORT) 55 MCG/ACT nasal inhaler Place 2 sprays into the nose daily.       No current facility-administered medications for this visit.    Review of Systems Review of Systems  Constitutional: Negative for fever, chills and unexpected weight change.  HENT: Negative for hearing loss, congestion, sore throat, trouble swallowing and voice change.   Eyes: Negative for visual disturbance.  Respiratory: Negative for cough and wheezing.   Cardiovascular: Negative for chest pain, palpitations and leg swelling.  Gastrointestinal: Negative for nausea, vomiting, abdominal pain, diarrhea, constipation, blood in stool, abdominal distention and anal bleeding.  Genitourinary: Negative for hematuria, vaginal bleeding and difficulty urinating.  Musculoskeletal: Negative for arthralgias.  Skin: Negative for rash and wound.  Neurological: Negative for seizures, syncope and headaches.  Hematological: Negative for adenopathy. Does not bruise/bleed easily.  Psychiatric/Behavioral: Negative for confusion.    Blood pressure 116/60, pulse 78, temperature 98 F (36.7 C), resp. rate 18, height 5' (1.524 m), weight 127 lb (57.607 kg), last menstrual period 02/17/2011.  Physical Exam Physical Exam  Vitals reviewed. Constitutional: She is oriented to person, place, and time. She appears well-developed and well-nourished.  Cardiovascular: Normal rate, regular rhythm and normal heart sounds.     Pulmonary/Chest: Effort normal and breath sounds normal. She has no wheezes. She has no rales. Right breast exhibits no inverted nipple, no mass, no nipple discharge, no skin change and no tenderness. Left breast exhibits no inverted nipple, no mass, no nipple discharge, no skin change and no tenderness.  Lymphadenopathy:    She has no cervical adenopathy.  Neurological: She is alert and oriented to person, place, and time.    Data Reviewed Mm, mm reports, path all  reviewed  Assessment    Right breast LCIS     Plan    Right breast wire guided excisional biopsy We discussed higher risk for her of future breast cancer and what LCIS means.  We discussed at minimum referral to high risk clinic postop due to some family history and her diagnosis of LCIS.  I recommended wire guided excisional biopsy of these calcifications to ensure no other lesions. We discussed performance of procedure, risks and possible further procedures.  Questions answered and will proceed soon.        Evelyn Castro 11/09/2012, 7:53 PM    

## 2012-11-09 NOTE — Progress Notes (Signed)
To come in for [facility] labs only

## 2012-11-10 ENCOUNTER — Encounter: Payer: Self-pay | Admitting: Gynecology

## 2012-11-11 ENCOUNTER — Encounter: Payer: Self-pay | Admitting: Gynecology

## 2012-11-11 ENCOUNTER — Ambulatory Visit (INDEPENDENT_AMBULATORY_CARE_PROVIDER_SITE_OTHER): Payer: BC Managed Care – PPO | Admitting: Gynecology

## 2012-11-11 ENCOUNTER — Encounter (HOSPITAL_BASED_OUTPATIENT_CLINIC_OR_DEPARTMENT_OTHER)
Admission: RE | Admit: 2012-11-11 | Discharge: 2012-11-11 | Disposition: A | Discharge status: Home / Self Care | Payer: BC Managed Care – PPO | Source: Ambulatory Visit | Attending: General Surgery | Admitting: General Surgery

## 2012-11-11 VITALS — BP 120/76 | Ht 60.0 in | Wt 126.0 lb

## 2012-11-11 DIAGNOSIS — Z01419 Encounter for gynecological examination (general) (routine) without abnormal findings: Secondary | ICD-10-CM

## 2012-11-11 LAB — BASIC METABOLIC PANEL
BUN: 7 mg/dL (ref 6–23)
Chloride: 101 mEq/L (ref 96–112)
GFR calc Af Amer: >90 mL/min (ref >90)
GFR calc non Af Amer: >90 mL/min (ref >90)
Potassium: 3.7 mEq/L (ref 3.5–5.1)
Sodium: 137 mEq/L (ref 135–145)

## 2012-11-11 LAB — CBC WITH DIFFERENTIAL/PLATELET
Basophils Absolute: 0.1 10*3/uL (ref 0.0–0.1)
Basophils Relative: 1 % (ref 0–1)
HCT: 36.6 % (ref 36.0–46.0)
Hemoglobin: 12.7 g/dL (ref 12.0–15.0)
Lymphocytes Relative: 23 % (ref 12–46)
MCHC: 34.7 g/dL (ref 30.0–36.0)
Monocytes Relative: 8 % (ref 3–12)
Neutro Abs: 2.9 10*3/uL (ref 1.7–7.7)
Neutrophils Relative %: 66 % (ref 43–77)
RDW: 13.2 % (ref 11.5–15.5)
WBC: 4.4 10*3/uL (ref 4.0–10.5)

## 2012-11-11 NOTE — Patient Instructions (Signed)
Follow up in one year for annual gynecologic exam. 

## 2012-11-11 NOTE — Progress Notes (Signed)
Evelyn Castro 10/21/1965 [id number]        47 y.o.  G0P0 for annual exam.  Several issues noted below.  Past medical history,surgical history, medications, allergies, family history and social history were all reviewed and documented in the EPIC chart. ROS:  Was performed and pertinent positives and negatives are included in the history.  Exam: [name] assistant Filed Vitals:   11/11/12 1203  BP: 120/76  Height: 5' (1.524 m)  Weight: 126 lb (57.153 kg)   General appearance  Normal Skin grossly normal Head/Neck normal with no cervical or supraclavicular adenopathy thyroid normal Lungs  clear Cardiac RR, without RMG Abdominal  soft, nontender, without masses, organomegaly or hernia Breasts  examined lying and sitting without masses, retractions, discharge or axillary adenopathy. Pelvic  Ext/BUS/vagina  normal   Adnexa  Without masses or tenderness    Anus and perineum  normal   Rectovaginal  normal sphincter tone without palpated masses or tenderness.    Assessment/Plan:  47 y.o. G0P0 female for annual exam.   1. Recent diagnosis with breast cancer. Patient actively being evaluated now with planned biopsy tomorrow. Will continue followup with her surgeons/radiologist/oncologist. 2. Status post TAH with history of leiomyomata and endometriosis. Not having any perimenopausal symptoms. We'll continue to monitor. 3. Pap smear 2012. No Pap smear done today. No history of abnormal Pap smears previously. Status post hysterectomy for benign indications. Options to stop screening altogether or less frequent screening intervals reviewed. We'll readdress on an annual basis. 4. Health maintenance. Had normal CBC and electrolytes drawn today. Lipid profile 2 years ago was normal we'll plan on repeating next year 3 year interval. Followup in one year for annual exam.    Evelyn Lords MD, 1:07 PM 11/11/2012

## 2012-11-12 ENCOUNTER — Encounter (HOSPITAL_BASED_OUTPATIENT_CLINIC_OR_DEPARTMENT_OTHER): Payer: Self-pay | Admitting: *Deleted

## 2012-11-12 ENCOUNTER — Encounter (HOSPITAL_BASED_OUTPATIENT_CLINIC_OR_DEPARTMENT_OTHER)
Admission: RE | Disposition: A | Discharge status: Home / Self Care | Payer: Self-pay | Source: Ambulatory Visit | Attending: General Surgery

## 2012-11-12 ENCOUNTER — Ambulatory Visit (HOSPITAL_BASED_OUTPATIENT_CLINIC_OR_DEPARTMENT_OTHER): Payer: BC Managed Care – PPO | Admitting: *Deleted

## 2012-11-12 ENCOUNTER — Ambulatory Visit (HOSPITAL_BASED_OUTPATIENT_CLINIC_OR_DEPARTMENT_OTHER)
Admission: RE | Admit: 2012-11-12 | Discharge: 2012-11-12 | Disposition: A | Discharge status: Home / Self Care | Payer: BC Managed Care – PPO | Source: Ambulatory Visit | Attending: General Surgery | Admitting: General Surgery

## 2012-11-12 DIAGNOSIS — D0590 Unspecified type of carcinoma in situ of unspecified breast: Secondary | ICD-10-CM | POA: Insufficient documentation

## 2012-11-12 DIAGNOSIS — D4860 Neoplasm of uncertain behavior of unspecified breast: Secondary | ICD-10-CM

## 2012-11-12 HISTORY — DX: Allergy status to unspecified drugs, medicaments and biological substances: Z88.9

## 2012-11-12 HISTORY — PX: BREAST BIOPSY: SHX20

## 2012-11-12 SURGERY — BREAST BIOPSY WITH NEEDLE LOCALIZATION
Anesthesia: General | Site: Breast | Laterality: Right | Wound class: Clean

## 2012-11-12 MED ORDER — FENTANYL CITRATE 0.05 MG/ML IJ SOLN: 25.0000 ug | INTRAMUSCULAR | Status: DC | PRN

## 2012-11-12 MED ORDER — ONDANSETRON HCL 4 MG/2ML IJ SOLN
INTRAMUSCULAR | Status: DC | PRN
  Administered 2012-11-12: 4 mg via INTRAVENOUS

## 2012-11-12 MED ORDER — LACTATED RINGERS IV SOLN
INTRAVENOUS | Status: DC
  Administered 2012-11-12 (×2): via INTRAVENOUS

## 2012-11-12 MED ORDER — PROPOFOL 10 MG/ML IV BOLUS
INTRAVENOUS | Status: DC | PRN
  Administered 2012-11-12: 140 mg via INTRAVENOUS

## 2012-11-12 MED ORDER — DEXAMETHASONE SODIUM PHOSPHATE 4 MG/ML IJ SOLN
INTRAMUSCULAR | Status: DC | PRN
  Administered 2012-11-12: 10 mg via INTRAVENOUS

## 2012-11-12 MED ORDER — ONDANSETRON HCL 4 MG/2ML IJ SOLN: 4.0000 mg | Freq: Four times a day (QID) | INTRAMUSCULAR | Status: DC | PRN

## 2012-11-12 MED ORDER — BUPIVACAINE HCL (PF) 0.25 % IJ SOLN
INTRAMUSCULAR | Status: DC | PRN
  Administered 2012-11-12: 17 mL

## 2012-11-12 MED ORDER — OXYCODONE HCL 5 MG PO TABS: 5.0000 mg | ORAL_TABLET | Freq: Once | ORAL | Status: DC | PRN

## 2012-11-12 MED ORDER — OXYCODONE-ACETAMINOPHEN 5-325 MG PO TABS: 1.0000 | ORAL_TABLET | ORAL | Status: DC | PRN

## 2012-11-12 MED ORDER — CEFAZOLIN SODIUM-DEXTROSE 2-3 GM-% IV SOLR
2.0000 g | INTRAVENOUS | Status: AC
  Administered 2012-11-12: 2 g via INTRAVENOUS

## 2012-11-12 MED ORDER — OXYCODONE HCL 5 MG/5ML PO SOLN: 5.0000 mg | Freq: Once | ORAL | Status: DC | PRN

## 2012-11-12 MED ORDER — MIDAZOLAM HCL 5 MG/5ML IJ SOLN
INTRAMUSCULAR | Status: DC | PRN
  Administered 2012-11-12: 1 mg via INTRAVENOUS

## 2012-11-12 MED ORDER — LIDOCAINE HCL (CARDIAC) 20 MG/ML IV SOLN
INTRAVENOUS | Status: DC | PRN
  Administered 2012-11-12: 60 mg via INTRAVENOUS

## 2012-11-12 MED ORDER — FENTANYL CITRATE 0.05 MG/ML IJ SOLN
INTRAMUSCULAR | Status: DC | PRN
  Administered 2012-11-12 (×2): 50 ug via INTRAVENOUS

## 2012-11-12 SURGICAL SUPPLY — 57 items
ADH SKN CLS APL DERMABOND .7 (GAUZE/BANDAGES/DRESSINGS) ×1
APL SKNCLS STERI-STRIP NONHPOA (GAUZE/BANDAGES/DRESSINGS) ×1
APPLIER CLIP 9.375 MED OPEN (MISCELLANEOUS)
APR CLP MED 9.3 20 MLT OPN (MISCELLANEOUS)
BENZOIN TINCTURE PRP APPL 2/3 (GAUZE/BANDAGES/DRESSINGS) ×2 IMPLANT
BINDER BREAST LRG (GAUZE/BANDAGES/DRESSINGS) IMPLANT
BINDER BREAST MEDIUM (GAUZE/BANDAGES/DRESSINGS) IMPLANT
BINDER BREAST XLRG (GAUZE/BANDAGES/DRESSINGS) IMPLANT
BINDER BREAST XXLRG (GAUZE/BANDAGES/DRESSINGS) IMPLANT
BLADE SURG 15 STRL LF DISP TIS (BLADE) ×1 IMPLANT
BLADE SURG 15 STRL SS (BLADE) ×2
CANISTER SUCTION 1200CC (MISCELLANEOUS) ×1 IMPLANT
CHLORAPREP W/TINT 26ML (MISCELLANEOUS) ×2 IMPLANT
CLIP APPLIE 9.375 MED OPEN (MISCELLANEOUS) IMPLANT
CLOTH BEACON ORANGE TIMEOUT ST (SAFETY) ×2 IMPLANT
COVER MAYO STAND STRL (DRAPES) ×2 IMPLANT
COVER TABLE BACK 60X90 (DRAPES) ×2 IMPLANT
DECANTER SPIKE VIAL GLASS SM (MISCELLANEOUS) IMPLANT
DERMABOND ADVANCED (GAUZE/BANDAGES/DRESSINGS) ×1
DERMABOND ADVANCED .7 DNX12 (GAUZE/BANDAGES/DRESSINGS) IMPLANT
DEVICE DUBIN W/COMP PLATE 8390 (MISCELLANEOUS) ×1 IMPLANT
DRAPE PED LAPAROTOMY (DRAPES) ×2 IMPLANT
DRSG TEGADERM 4X4.75 (GAUZE/BANDAGES/DRESSINGS) ×2 IMPLANT
ELECT COATED BLADE 2.86 ST (ELECTRODE) ×2 IMPLANT
ELECT REM PT RETURN 9FT ADLT (ELECTROSURGICAL) ×2
ELECTRODE REM PT RTRN 9FT ADLT (ELECTROSURGICAL) ×1 IMPLANT
GAUZE SPONGE 4X4 12PLY STRL LF (GAUZE/BANDAGES/DRESSINGS) ×1 IMPLANT
GLOVE BIO SURGEON STRL SZ 6.5 (GLOVE) ×1 IMPLANT
GLOVE BIO SURGEON STRL SZ7 (GLOVE) ×2 IMPLANT
GLOVE BIOGEL PI IND STRL 7.5 (GLOVE) ×1 IMPLANT
GLOVE BIOGEL PI INDICATOR 7.5 (GLOVE) ×1
GLOVE INDICATOR 7.0 STRL GRN (GLOVE) ×1 IMPLANT
GOWN PREVENTION PLUS XLARGE (GOWN DISPOSABLE) ×3 IMPLANT
KIT MARKER MARGIN INK (KITS) ×1 IMPLANT
NDL HYPO 25X1 1.5 SAFETY (NEEDLE) ×1 IMPLANT
NEEDLE HYPO 25X1 1.5 SAFETY (NEEDLE) ×2 IMPLANT
NS IRRIG 1000ML POUR BTL (IV SOLUTION) IMPLANT
PACK BASIN DAY SURGERY FS (CUSTOM PROCEDURE TRAY) ×2 IMPLANT
PENCIL BUTTON HOLSTER BLD 10FT (ELECTRODE) ×2 IMPLANT
SLEEVE SCD COMPRESS KNEE MED (MISCELLANEOUS) ×2 IMPLANT
SPONGE LAP 4X18 X RAY DECT (DISPOSABLE) ×2 IMPLANT
STAPLER VISISTAT 35W (STAPLE) ×1 IMPLANT
STRIP CLOSURE SKIN 1/2X4 (GAUZE/BANDAGES/DRESSINGS) ×2 IMPLANT
SUT MNCRL AB 4-0 PS2 18 (SUTURE) ×1 IMPLANT
SUT MON AB 5-0 PS2 18 (SUTURE) IMPLANT
SUT SILK 2 0 SH (SUTURE) ×2 IMPLANT
SUT VIC AB 2-0 SH 27 (SUTURE) ×2
SUT VIC AB 2-0 SH 27XBRD (SUTURE) ×1 IMPLANT
SUT VIC AB 3-0 SH 27 (SUTURE) ×2
SUT VIC AB 3-0 SH 27X BRD (SUTURE) ×1 IMPLANT
SUT VIC AB 5-0 PS2 18 (SUTURE) IMPLANT
SUT VICRYL AB 3 0 TIES (SUTURE) IMPLANT
SYR CONTROL 10ML LL (SYRINGE) ×2 IMPLANT
TOWEL OR 17X24 6PK STRL BLUE (TOWEL DISPOSABLE) ×2 IMPLANT
TOWEL OR NON WOVEN STRL DISP B (DISPOSABLE) ×2 IMPLANT
TUBE CONNECTING 20X1/4 (TUBING) ×1 IMPLANT
YANKAUER SUCT BULB TIP NO VENT (SUCTIONS) ×1 IMPLANT

## 2012-11-12 NOTE — Op Note (Signed)
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 Harden Mo Anesthesia: Gen. With LMA Estimated blood loss: Minimal Specimens: Right breast tissue marked with paint Complications: None Drains: None Sponge count correct at end of operation Disposition to recovery in stable condition  Indications: This is a 47 year old female who underwent a screening mammogram with finding of a right breast abnormality. She has undergone a core biopsy which shows a lobular carcinoma in situ. She and I had a long discussion about the diagnosis of a lobular carcinoma in situ. We discussed an excisional biopsy with wire guidance. The risks and benefits of the procedure were discussed.  Procedure: The patient had her first had a wire placed by Dr. Anne Hahn at the breast center. I had these mammograms in the operating room. She was then brought to the operating room. She was administered 2 g of intravenous cefazolin. Sequential compression devices were placed on her leg. She was then placed under general anesthesia with an LMA. Her right breast was prepped and draped in the standard sterile surgical fashion. A surgical timeout was then performed.  I infiltrated Marcaine throughout the right upper outer quadrant. I then made a curvilinear incision overlying the wire. I then used cautery to excise the wire and the surrounding tissue. This was then passed off the table as a specimen. I marked this with paint. I then performed a Faxitron mammogram which confirmed removal of the clip and the wire. This was confirmed by Dr Anne Hahn as well. I then obtained hemostasis. I closed her breast tissue with a 2-0 Vicryl. I closed the skin with a 3-0 Vicryl and a 4-0 Monocryl. Dermabond and Steri-Strips were applied. I infiltrated another 10 cc of Marcaine. She tolerated this  well was extubated in the operating room and transferred to recovery in stable condition.

## 2012-11-12 NOTE — Interval H&P Note (Signed)
History and Physical Interval Note:  11/12/2012 12:52 PM  Evelyn Castro  has presented today for surgery, with the diagnosis of right breast mass  The various methods of treatment have been discussed with the patient and family. After consideration of risks, benefits and other options for treatment, the patient has consented to  Procedure(s): RIGHT BREAST WIRE LOCALIZED EXCISION  (Right) as a surgical intervention .  The patient's history has been reviewed, patient examined, no change in status, stable for surgery.  I have reviewed the patient's chart and labs.  Questions were answered to the patient's satisfaction.     Katera Rybka

## 2012-11-12 NOTE — Transfer of Care (Deleted)
Immediate Anesthesia Transfer of Care Note  Patient: Evelyn Castro  Procedure(s) Performed: Procedure(s): RIGHT BREAST WIRE LOCALIZED EXCISION  (Right)  Patient Location: PACU  Anesthesia Type:General  Level of Consciousness: awake, alert  and oriented  Airway & Oxygen Therapy: Patient Spontanous Breathing and Patient connected to face mask oxygen  Post-op Assessment: Report given to PACU RN, Post -op Vital signs reviewed and stable and Patient moving all extremities  Post vital signs: Reviewed and stable  Complications: No apparent anesthesia complications 

## 2012-11-12 NOTE — H&P (View-Only) (Signed)
Patient ID: Evelyn Castro, female   DOB: 10-03-1965, 47 y.o.   MRN: [id number]  Chief Complaint  Patient presents with  . New Evaluation    Abd.norm breast     HPI Evelyn Castro is a 47 y.o. female.  Referred by Dr Tilda Burrow HPI 23 yof who is otherwise healthy presents after screening mm showing right breast calcifications. She also has left breast cysts seen on u/s.  She has no complaints referable to either breast.  She underwent core biopsy with finding of LCIS.  She comes in today to discuss options. She does have fh in ma, great aunt of breast cancer and uterine cancer.  Past Medical History  Diagnosis Date  . No pertinent past medical history   . Fibroid   . Varicose veins   . Endometriosis   . Multiple allergies   . Medical history non-contributory     Past Surgical History  Procedure Laterality Date  . Varicose veins  2000, 2001  . Total abdominal hysterectomy  8.27.12    DR.TF ASSISTED  . Abdominal hysterectomy  02/25/2011    Procedure: HYSTERECTOMY ABDOMINAL;  Surgeon: Trellis Paganini, MD;  Location: [facility];  Service: Gynecology;  Laterality: N/A;    Family History  Problem Relation Age of Onset  . Hypertension Mother   . Arthritis Mother   . Hypertension Father   . Breast cancer Maternal Aunt     age 80  . Uterine cancer Maternal Grandmother   . Cancer Maternal Grandmother     died age 1    Social History History  Substance Use Topics  . Smoking status: Never Smoker   . Smokeless tobacco: Not on file  . Alcohol Use: 3.5 oz/week    7 drink(s) per week     Comment: sometimes daily    No Known Allergies  Current Outpatient Prescriptions  Medication Sig Dispense Refill  . DiphenhydrAMINE HCl (BENADRYL PO) Take 25 mg by mouth daily as needed. For allergy symptoms       . ibuprofen (ADVIL,MOTRIN) 200 MG tablet Take 200 mg by mouth 4 (four) times daily as needed. For menstrual cramps      . Multiple Vitamin (MULTIVITAMIN) capsule Take 1 capsule  by mouth daily.       Marland Kitchen triamcinolone (NASACORT) 55 MCG/ACT nasal inhaler Place 2 sprays into the nose daily.       No current facility-administered medications for this visit.    Review of Systems Review of Systems  Constitutional: Negative for fever, chills and unexpected weight change.  HENT: Negative for hearing loss, congestion, sore throat, trouble swallowing and voice change.   Eyes: Negative for visual disturbance.  Respiratory: Negative for cough and wheezing.   Cardiovascular: Negative for chest pain, palpitations and leg swelling.  Gastrointestinal: Negative for nausea, vomiting, abdominal pain, diarrhea, constipation, blood in stool, abdominal distention and anal bleeding.  Genitourinary: Negative for hematuria, vaginal bleeding and difficulty urinating.  Musculoskeletal: Negative for arthralgias.  Skin: Negative for rash and wound.  Neurological: Negative for seizures, syncope and headaches.  Hematological: Negative for adenopathy. Does not bruise/bleed easily.  Psychiatric/Behavioral: Negative for confusion.    Blood pressure 116/60, pulse 78, temperature 98 F (36.7 C), resp. rate 18, height 5' (1.524 m), weight 127 lb (57.607 kg), last menstrual period 02/17/2011.  Physical Exam Physical Exam  Vitals reviewed. Constitutional: She is oriented to person, place, and time. She appears well-developed and well-nourished.  Cardiovascular: Normal rate, regular rhythm and normal heart sounds.  Pulmonary/Chest: Effort normal and breath sounds normal. She has no wheezes. She has no rales. Right breast exhibits no inverted nipple, no mass, no nipple discharge, no skin change and no tenderness. Left breast exhibits no inverted nipple, no mass, no nipple discharge, no skin change and no tenderness.  Lymphadenopathy:    She has no cervical adenopathy.  Neurological: She is alert and oriented to person, place, and time.    Data Reviewed Mm, mm reports, path all  reviewed  Assessment    Right breast LCIS     Plan    Right breast wire guided excisional biopsy We discussed higher risk for her of future breast cancer and what LCIS means.  We discussed at minimum referral to high risk clinic postop due to some family history and her diagnosis of LCIS.  I recommended wire guided excisional biopsy of these calcifications to ensure no other lesions. We discussed performance of procedure, risks and possible further procedures.  Questions answered and will proceed soon.        Jerrold Haskell 11/09/2012, 7:53 PM

## 2012-11-12 NOTE — Transfer of Care (Signed)
Immediate Anesthesia Transfer of Care Note  Patient: Evelyn Castro  Procedure(s) Performed: Procedure(s): RIGHT BREAST WIRE LOCALIZED EXCISION  (Right)  Patient Location: PACU  Anesthesia Type:General  Level of Consciousness: awake, alert  and oriented  Airway & Oxygen Therapy: Patient Spontanous Breathing and Patient connected to face mask oxygen  Post-op Assessment: Report given to PACU RN, Post -op Vital signs reviewed and stable and Patient moving all extremities  Post vital signs: Reviewed and stable  Complications: No apparent anesthesia complications

## 2012-11-12 NOTE — Anesthesia Procedure Notes (Signed)
Procedure Name: LMA Insertion Date/Time: 11/12/2012 2:06 PM Performed by: Meyer Russel Pre-anesthesia Checklist: Patient identified, Emergency Drugs available, Suction available and Patient being monitored Patient Re-evaluated:Patient Re-evaluated prior to inductionOxygen Delivery Method: Circle System Utilized Preoxygenation: Pre-oxygenation with 100% oxygen Intubation Type: IV induction Ventilation: Mask ventilation without difficulty LMA: LMA inserted LMA Size: 4.0 Number of attempts: 1 Airway Equipment and Method: bite block Placement Confirmation: positive ETCO2 and breath sounds checked- equal and bilateral Tube secured with: Tape Dental Injury: Teeth and Oropharynx as per pre-operative assessment

## 2012-11-12 NOTE — Anesthesia Postprocedure Evaluation (Signed)
Anesthesia Post Note  Patient: Evelyn Castro  Procedure(s) Performed: Procedure(s) (LRB): RIGHT BREAST WIRE LOCALIZED EXCISION  (Right)  Anesthesia type: General  Patient location: PACU  Post pain: Pain level controlled and Adequate analgesia  Post assessment: Post-op Vital signs reviewed, Patient's Cardiovascular Status Stable, Respiratory Function Stable, Patent Airway and Pain level controlled  Last Vitals:  Filed Vitals:   11/12/12 1515  BP: 116/62  Pulse: 90  Temp:   Resp: 11    Post vital signs: Reviewed and stable  Level of consciousness: awake, alert  and oriented  Complications: No apparent anesthesia complications

## 2012-11-12 NOTE — Anesthesia Preprocedure Evaluation (Signed)
Anesthesia Evaluation  Patient identified by MRN, date of birth, ID band Patient awake    Reviewed: Allergy & Precautions, H&P , NPO status , Patient's Chart, lab work & pertinent test results  Airway Mallampati: II  Neck ROM: full    Dental   Pulmonary          Cardiovascular     Neuro/Psych    GI/Hepatic   Endo/Other    Renal/GU      Musculoskeletal   Abdominal   Peds  Hematology   Anesthesia Other Findings   Reproductive/Obstetrics                           Anesthesia Physical Anesthesia Plan  ASA: I  Anesthesia Plan: General   Post-op Pain Management:    Induction: Intravenous  Airway Management Planned: LMA  Additional Equipment:   Intra-op Plan:   Post-operative Plan:   Informed Consent: I have reviewed the patients History and Physical, chart, labs and discussed the procedure including the risks, benefits and alternatives for the proposed anesthesia with the patient or authorized representative who has indicated his/her understanding and acceptance.     Plan Discussed with: CRNA, Anesthesiologist and Surgeon  Anesthesia Plan Comments:         Anesthesia Quick Evaluation  

## 2012-11-13 ENCOUNTER — Encounter (HOSPITAL_BASED_OUTPATIENT_CLINIC_OR_DEPARTMENT_OTHER): Payer: Self-pay | Admitting: General Surgery

## 2012-11-16 ENCOUNTER — Telehealth (INDEPENDENT_AMBULATORY_CARE_PROVIDER_SITE_OTHER): Payer: Self-pay | Admitting: General Surgery

## 2012-11-16 ENCOUNTER — Other Ambulatory Visit (INDEPENDENT_AMBULATORY_CARE_PROVIDER_SITE_OTHER): Payer: Self-pay | Admitting: General Surgery

## 2012-11-16 DIAGNOSIS — D0501 Lobular carcinoma in situ of right breast: Secondary | ICD-10-CM

## 2012-11-16 NOTE — Progress Notes (Signed)
I routed the request for appt with Dr Welton Flakes high risk clinic to [facility].

## 2012-11-16 NOTE — Telephone Encounter (Signed)
Spoke with patient she is aware of po f/u appt on 12/07/12 at 430 pm

## 2012-11-18 ENCOUNTER — Telehealth: Payer: Self-pay | Admitting: Oncology

## 2012-11-18 NOTE — Telephone Encounter (Signed)
S/W PT IN [facility] ON 06/12 @ 1:30 W/DR. KHAN.

## 2012-11-27 ENCOUNTER — Encounter (INDEPENDENT_AMBULATORY_CARE_PROVIDER_SITE_OTHER): Payer: Self-pay

## 2012-11-30 ENCOUNTER — Ambulatory Visit (INDEPENDENT_AMBULATORY_CARE_PROVIDER_SITE_OTHER): Payer: BC Managed Care – PPO | Admitting: General Surgery

## 2012-12-07 ENCOUNTER — Encounter (INDEPENDENT_AMBULATORY_CARE_PROVIDER_SITE_OTHER): Payer: Self-pay | Admitting: General Surgery

## 2012-12-07 ENCOUNTER — Ambulatory Visit (INDEPENDENT_AMBULATORY_CARE_PROVIDER_SITE_OTHER): Payer: BC Managed Care – PPO | Admitting: General Surgery

## 2012-12-07 VITALS — BP 130/78 | HR 78 | Temp 97.6°F | Resp 16 | Ht 60.0 in | Wt 128.6 lb

## 2012-12-07 DIAGNOSIS — Z09 Encounter for follow-up examination after completed treatment for conditions other than malignant neoplasm: Secondary | ICD-10-CM

## 2012-12-07 NOTE — Patient Instructions (Signed)
Breast Self-Examination You should begin examining your breasts at age 48 even though the risk for breast cancer is low in this age group. It is important to become familiar with how your breasts look and feel. This is true for pregnant women, nursing mothers, women in menopause and women who have breast implants.  Women should examine their breasts once a month to look for changes and lumps. By doing monthly breast exams, you get to know how your breasts feel and how they can change from month to month. This allows you to pick up changes early. It can also offer you some reassurance that your breast health is good. This exam only takes minutes. Most breast lumps are not caused by cancer. If you find a lump, a special x-ray called a mammogram, or other tests may be needed to determine what is wrong.  Some of the signs that a breast lump is caused by cancer include:  Dimpling of the skin or changes in the shape of the breast or nipple.   A dark-colored or bloody discharge from the nipple.   Swollen lymph glands around the breast or in the armpit.   Redness of the breast or nipple.   Scaly nipple or skin on the breast.   Pain or swelling of the breast.  SELF-EXAM There are a few points to follow when doing a thorough breast exam. The best time to examine your breasts is 5 to 7 days after the menstrual period is over. During menstruation, the breasts are lumpier, and it may be more difficult to pick up changes. If you do not menstruate, have reached menopause or had a hysterectomy, examine your breasts the first day of every month. After three to four months, you will become more familiar with the variations of your breasts and more comfortable with the exam.  Perform your breast exam monthly. Keep a written record with breast changes or normal findings for each breast. This makes it easier to be sure of changes and to not solely depend on memory for size, tenderness, or location. Try to do the exam  at the same time each month, and write down where you are in your menstrual cycle if you are still menstruating.   Look at your breasts. Stand in front of a mirror with your hands clasped behind your head. Tighten your chest muscles and look for asymmetry. This means a difference in shape or contour from one breast to the other, such as puckers, dips or bumps. Look also for skin changes.   Lean forward with your hands on your hips. Again, look for symmetry and skin changes.   While showering, soap the breasts, and carefully feel the breasts with fingertips while holding the arm (on the side of the breast being examined) over the head. Do this with each breast carefully feeling for lumps or changes. Typically, a circular motion with moderate fingertip pressure should be used.   Repeat this exam while lying on your back, again with your arm behind your head and a pillow under your shoulders. Again, use your fingertips to examine both breasts, feeling for lumps and thickening. Begin at 1 o'clock and go clockwise around the whole breast.   At the end of your exam, gently squeeze each nipple to see if there is any drainage. Look for nipple changes, dimpling or redness.   Lastly, examine the upper chest and clavicle areas and in your armpits.  It is not necessary to become alarmed if you find   a breast lump. Most of them are not cancerous. However, it is necessary to see your caregiver if a lump is found in order to have it looked at. Document Released: 07/25/2004 Document Revised: 02/27/2011 Document Reviewed: 10/04/2008 Exit[employer] Patient Information 2012 [employer]. 

## 2012-12-07 NOTE — Progress Notes (Signed)
Subjective:     Patient ID: Evelyn Castro, female   DOB: 04-10-1966, 47 y.o.   MRN: [id number]  HPI This is a 47 year old female who had lobular carcinoma in situ a core biopsy of her right breast. She has a family history. I took her to the operating room and a right breast wire-guided excision. This final pathology is consistent with lobular carcinoma in situ. She returns today doing well without any complaints.  Review of Systems     Objective:   Physical Exam Healing right upper outer quadrant incision without infection     Assessment:     Family history of breast cancer Lobular carcinoma in situ     Plan:     She did well postoperatively she can return to all of her normal activities. I will have her come back and see me as needed. I have referred her over to the high risk clinic at the cancer Center to discuss the diagnosis of lobular carcinoma in situ to discuss the various screening and prevention regimens.

## 2012-12-10 ENCOUNTER — Telehealth: Payer: Self-pay | Admitting: Oncology

## 2012-12-10 ENCOUNTER — Ambulatory Visit (HOSPITAL_BASED_OUTPATIENT_CLINIC_OR_DEPARTMENT_OTHER): Payer: BC Managed Care – PPO | Admitting: Oncology

## 2012-12-10 ENCOUNTER — Encounter: Payer: Self-pay | Admitting: Oncology

## 2012-12-10 VITALS — BP 102/70 | HR 101 | Temp 98.5°F | Resp 20 | Ht 60.0 in | Wt 126.8 lb

## 2012-12-10 DIAGNOSIS — D0590 Unspecified type of carcinoma in situ of unspecified breast: Secondary | ICD-10-CM

## 2012-12-10 DIAGNOSIS — D0501 Lobular carcinoma in situ of right breast: Secondary | ICD-10-CM

## 2012-12-10 MED ORDER — TAMOXIFEN CITRATE 20 MG PO TABS: 20.0000 mg | ORAL_TABLET | Freq: Every day | ORAL | Status: DC

## 2012-12-10 NOTE — Patient Instructions (Addendum)
We discussed your diagnosis and pathology of LCIS  We discussed prevention for future breast cnacer with tamoxifen and lifestyle  Tamoxifen 20 mg daily and plan on continuing for 5 years  I will see you back in 3 months  Tamoxifen oral tablet What is this medicine? TAMOXIFEN (ta MOX i fen) blocks the effects of estrogen. It is commonly used to treat breast cancer. It is also used to decrease the chance of breast cancer coming back in women who have received treatment for the disease. It may also help prevent breast cancer in women who have a high risk of developing breast cancer. This medicine may be used for other purposes; ask your health care provider or pharmacist if you have questions. What should I tell my health care provider before I take this medicine? They need to know if you have any of these conditions: -blood clots -blood disease -cataracts or impaired eyesight -endometriosis -high calcium levels -high cholesterol -irregular menstrual cycles -liver disease -stroke -uterine fibroids -an unusual or allergic reaction to tamoxifen, other medicines, foods, dyes, or preservatives -pregnant or trying to get pregnant -breast-feeding How should I use this medicine? Take this medicine by mouth with a glass of water. Follow the directions on the prescription label. You can take it with or without food. Take your medicine at regular intervals. Do not take your medicine more often than directed. Do not stop taking except on your doctor's advice. A special MedGuide will be given to you by the pharmacist with each prescription and refill. Be sure to read this information carefully each time. Talk to your pediatrician regarding the use of this medicine in children. While this drug may be prescribed for selected conditions, precautions do apply. Overdosage: If you think you have taken too much of this medicine contact a poison control center or emergency room at once. NOTE: This medicine  is only for you. Do not share this medicine with others. What if I miss a dose? If you miss a dose, take it as soon as you can. If it is almost time for your next dose, take only that dose. Do not take double or extra doses. What may interact with this medicine? -aminoglutethimide -bromocriptine -chemotherapy drugs -female hormones, like estrogens and birth control pills -letrozole -medroxyprogesterone -phenobarbital -rifampin -warfarin This list may not describe all possible interactions. Give your health care provider a list of all the medicines, herbs, non-prescription drugs, or dietary supplements you use. Also tell them if you smoke, drink alcohol, or use illegal drugs. Some items may interact with your medicine. What should I watch for while using this medicine? Visit your doctor or health care professional for regular checks on your progress. You will need regular pelvic exams, breast exams, and mammograms. If you are taking this medicine to reduce your risk of getting breast cancer, you should know that this medicine does not prevent all types of breast cancer. If breast cancer or other problems occur, there is no guarantee that it will be found at an early stage. Do not become pregnant while taking this medicine or for 2 months after stopping this medicine. Stop taking this medicine if you get pregnant or think you are pregnant and contact your doctor. This medicine may harm your unborn baby. Women who can possibly become pregnant should use birth control methods that do not use hormones during tamoxifen treatment and for 2 months after therapy has stopped. Talk with your health care provider for birth control advice. Do not breast  feed while taking this medicine. What side effects may I notice from receiving this medicine? Side effects that you should report to your doctor or health care professional as soon as possible: -changes in vision (blurred vision) -changes in your menstrual  cycle -difficulty breathing or shortness of breath -difficulty walking or talking -new breast lumps -numbness -pelvic pain or pressure -redness, blistering, peeling or loosening of the skin, including inside the mouth -skin rash or itching (hives) -sudden chest pain -swelling of lips, face, or tongue -swelling, pain or tenderness in your calf or leg -unusual bruising or bleeding -vaginal discharge that is bloody, brown, or rust -weakness -yellowing of the whites of the eyes or skin Side effects that usually do not require medical attention (report to your doctor or health care professional if they continue or are bothersome): -fatigue -hair loss, although uncommon and is usually mild -headache -hot flashes -impotence (in men) -nausea, vomiting (mild) -vaginal discharge (white or clear) This list may not describe all possible side effects. Call your doctor for medical advice about side effects. You may report side effects to FDA at 1-800-FDA-1088. Where should I keep my medicine? Keep out of the reach of children. Store at room temperature between 20 and 25 degrees C (68 and 77 degrees F). Protect from light. Keep container tightly closed. Throw away any unused medicine after the expiration date. NOTE: This sheet is a summary. It may not cover all possible information. If you have questions about this medicine, talk to your doctor, pharmacist, or health care provider.  2012, [employer]. (03/03/2008 12:01:56 PM)

## 2012-12-10 NOTE — Telephone Encounter (Signed)
, °

## 2012-12-10 NOTE — Progress Notes (Signed)
[facility]  High Risk Clinic New Patient Evaluation  Name: Evelyn Castro            Date: 12/10/2012 MRN: [id number]                DOB: 03/19/1966  CC:  Dr. Emelia Castro  REFERRING PHYSICIAN: Dr. 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 female who underwent a screening mammogram and was found to have right breast calcifications. She also had left breast cysts on ultrasound. Patient went on to have a core biopsy of the right calcifications and the findings were consistent with lobular carcinoma in situ. She was seen by Dr. Emelia Castro who recommended a wire guided excisional biopsy. She had the procedure performed on 11/11/2012. The pathology revealed lobular carcinoma in situ. She tolerated the surgery well without any problems. She is back to her normal activities. She is seen in the high-risk clinic for discussion of future breast cancer risk reduction. She does have family history of breast cancer in maternal aunt and uterine cancer in a maternal grandmother. She has not had prior history of breast cancers or any other breast problems.  PAST MEDICAL HISTORY:  has a past medical history of Varicose veins and Multiple allergies.  PAST SURGICAL HISTORY:  Past Surgical History  Procedure Laterality Date  . Varicose veins  2000, 2001  . Total abdominal hysterectomy  8.27.12    DR.TF ASSISTED  . Abdominal hysterectomy  02/25/2011    Procedure: HYSTERECTOMY ABDOMINAL;  Surgeon: Trellis Paganini, MD;  Location: [facility];  Service: Gynecology;  Laterality: N/A;  . Breast biopsy Right 11/12/2012    Procedure: RIGHT BREAST WIRE LOCALIZED EXCISION ;  Surgeon: Evelyn Loron, MD;  Location: [facility];  Service: General;  Laterality: Right;      CURRENT MEDICATIONS: [name] does  not currently have medications on file.  ALLERGIES: Review of patient's allergies indicates no known allergies.  SOCIAL HISTORY:  reports that she has never smoked. She does not have any smokeless tobacco history on file. She reports that she drinks about 3.5 ounces of alcohol per week. She reports that she does not use illicit drugs.  HEALTH HABITS: Vitamins: multivitamin Supplements: no Alternative Therapies: no Adverse environmental exposure:no Servings of fruit and vegetables/day: 5-6 Servings of meat/day: 2 Exercises regularly:        90         Min/wk: Smoker/nonsmoker: none Alcohol: 1-2/day Number of alcoholic beverages/week: 7/week  REPRODUCTIVE HISTORY:  Menarche age: 29 Gravida:   0    Para: 0 First Live Birth: 0 Number of live births: 0 Breast fed: Y/N  # months0 Took fertility meds:       0              Type:  Menses: hysterctomy Oral Contraceptives:         # of years Menopause: natural/surgical  Age surgical menopause at 8 HRT Y/N Currently Y/N TType:     none                              # years Sexually transmitted disease:  none  FAMILY HISTORY:  family history includes Arthritis in her mother; Breast cancer in her maternal aunt; Hypertension in her  father and mother; and Uterine cancer in her maternal grandmother.  HEALTH MAINTENANCE: Last mammogram: may 2014 Last clinical breast exam: yes Performs self breast exam: yes Last Pap Smear:  May 2013 Colonoscopy: none Last skin exam: none  REVIEW OF SYSTEMS:  General: Negative for fever, chills, night sweats,  loss of appetite or weight loss. HEENT: Negative for headaches, sore  throat, difficulty swallowing, blurred vision or problem with hearing or  sinus congestion. Respiratory: Negative for shortness of breath, cough  or dyspnea on exertion. Cardiovascular: Negative for chest pain,  palpitations or pedal edema. GI: Negative for nausea, vomiting,  diarrhea, constipation, change in bowel habits or blood  in the stool.  No jaundice. GU: Negative for painful or frequent urination, change in  color of urine, or decreased urinary stream. Integumentary: Negative  for skin rashes or other suspicious skin lesions. Hematologic: Negative  for easy bruisability or bleeding. Musculoskeletal: Negative for  complaints of pain, arthralgias, arthritis or myalgias.  Neurological/psychiatric: Negative for numbness, focal weakness,  balance problems or coordination difficulties. No depression or mood swings.  Breast: No self detected abnormalities in the breast. No nipple discharge, masses or redness of the skin.   PHYSICAL EXAM: BP 102/70  Pulse 101  Temp(Src) 98.5 F (36.9 C) (Oral)  Resp 20  Ht 5' (1.524 m)  Wt 126 lb 12.8 oz (57.516 kg)  BMI 24.76 kg/m2  LMP 02/17/2011 GENERAL: Well developed, well nourished, in no acute distress.  EENT: No ocular or oral lesions. No stomatitis.  RESPIRATORY: Lungs are clear to auscultation bilaterally with normal respiratory movement and no accessory muscle use. CARDIAC: No murmur, rub or tachycardia. No upper or lower extremity edema.  GI: Abdomen is soft, no palpable hepatosplenomegaly. No fluid wave. No tenderness. Musculoskeletal: No kyphosis, no tenderness over the spine, ribs or hips. Lymph: No cervical, infraclavicular, axillary or inguinal adenopathy. Neuro: No focal neurological deficits. Psych: Alert and oriented X 3, appropriate mood and affect.  BREAST EXAM: In the supine position, with the right arm over the head, right nipple is everted. No periareolar edema or nipple discharge. No mass in any quadrant or subareolar region. No redness of the skin. No right axillary adenopathy. With the left arm over the head, left nipple is everted. No periareolar edema or nipple discharge. No mass in any quadrant or subareolar region. No redness of the skin. No left axillary adenopathy.    ASSESSMENT: 47 year old female with family history of breast and uterine  cancer in second-degree relatives. Patient's mother and sisters have not had any kind of malignancies. Patient herself now with personal history of LCIS. She's had a completely excised by Dr. Emelia Castro. She has no evidence of invasive disease or any other areas of concern. Patient and I discussed her pathology we discussed her radiology. We discussed the future implications of having LCIS as a high-risk lesion. I do think that she her overall future risk of developing breast cancer is higher than the general patient population. We utilized the [employer] along with [employer]. By calculation of the Ibis nodule using patient's age of 74 menarche at age 33 her known LCIS status and a maternal aunt with breast cancer patients risk after 10 years is 18.8% with the 10 year population risk being only 2.5% patient's lifetime risk calculated to 65.3% with the lifetime population risk is 12%. Because of these findings I did recommend that patient proceed with antiestrogen therapy for chemotherapy prophylaxis. We discussed tamoxifen as a selective estrogen receptor  modulator to help prevent breast cancers in the future for her. We discussed how tamoxifen works to prevent breast cancer. We discussed side effects of tamoxifen including but not limited to blood clots strokes hot flashes DVTs uterine cancer risk. After extensive discussion patient did opt for taking tamoxifen and a prescription was given to her. We also discussed lifestyle changes including exercise eating healthy increasing her servings of fruits and vegetables reducing red meat increasing chicken and fish protein eating habits. We discussed relaxation reducing stress. Patient will continue to need mammograms on a yearly basis she should also have MRIs for early detection of breast cancer. Because of her increased risk for NCCN  guidelines she would be eligible to get annual MRIs.  PLAN:  #1 patient will begin tamoxifen 20 mg daily chemoprevention for  5 years.  #2 patient will exercise, reduce stress, and emphasize healthy young style.,  #3 patient will continue annual mammograms with MRIs annually as well for early detection. She will do self breast examinations as well as clinical exams done on a yearly basis.  #4 I will see the patient back in 3 months time for followup.  Drue Second, MD Medical/Oncology [facility] [phone] (beeper) [phone] (Office)

## 2012-12-10 NOTE — Telephone Encounter (Signed)
C/D 12/10/12 for appt. 12/10/12

## 2013-03-18 ENCOUNTER — Other Ambulatory Visit (HOSPITAL_BASED_OUTPATIENT_CLINIC_OR_DEPARTMENT_OTHER): Payer: BC Managed Care – PPO | Admitting: Lab

## 2013-03-18 ENCOUNTER — Encounter: Payer: Self-pay | Admitting: Oncology

## 2013-03-18 ENCOUNTER — Ambulatory Visit (HOSPITAL_BASED_OUTPATIENT_CLINIC_OR_DEPARTMENT_OTHER): Payer: BC Managed Care – PPO | Admitting: Oncology

## 2013-03-18 ENCOUNTER — Telehealth: Payer: Self-pay | Admitting: *Deleted

## 2013-03-18 VITALS — BP 111/74 | HR 76 | Temp 98.3°F | Resp 20 | Ht 60.0 in | Wt 123.3 lb

## 2013-03-18 DIAGNOSIS — D0501 Lobular carcinoma in situ of right breast: Secondary | ICD-10-CM

## 2013-03-18 DIAGNOSIS — D0590 Unspecified type of carcinoma in situ of unspecified breast: Secondary | ICD-10-CM

## 2013-03-18 DIAGNOSIS — D0502 Lobular carcinoma in situ of left breast: Secondary | ICD-10-CM

## 2013-03-18 LAB — COMPREHENSIVE METABOLIC PANEL (CC13)
ALT: 21 U/L (ref 0–55)
Albumin: 4 g/dL (ref 3.5–5.0)
CO2: 24 mEq/L (ref 22–29)
Chloride: 108 mEq/L (ref 98–109)
Glucose: 108 mg/dl (ref 70–140)
Potassium: 4.1 mEq/L (ref 3.5–5.1)
Sodium: 141 mEq/L (ref 136–145)
Total Protein: 7.3 g/dL (ref 6.4–8.3)

## 2013-03-18 LAB — CBC WITH DIFFERENTIAL/PLATELET
BASO%: 1.2 % (ref 0.0–2.0)
Eosinophils Absolute: 0 10*3/uL (ref 0.0–0.5)
MCHC: 34 g/dL (ref 31.5–36.0)
MONO#: 0.4 10*3/uL (ref 0.1–0.9)
NEUT#: 5 10*3/uL (ref 1.5–6.5)
Platelets: 249 10*3/uL (ref 145–400)
RBC: 4.4 10*6/uL (ref 3.70–5.45)
RDW: 13.9 % (ref 11.2–14.5)
WBC: 6.7 10*3/uL (ref 3.9–10.3)
lymph#: 1.2 10*3/uL (ref 0.9–3.3)

## 2013-03-18 NOTE — Patient Instructions (Addendum)
Continue tamoxifen 20 mg daily  I will see you back in 1 year 

## 2013-03-18 NOTE — Telephone Encounter (Signed)
appts made and printed...td 

## 2013-03-21 NOTE — Progress Notes (Signed)
OFFICE PROGRESS NOTE  CC**  Evelyn Lords, MD [address] [place] [address] Dr. 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 She also had left breast cysts on ultrasound. Patient went on to have a core biopsy of the right calcifications and the findings were consistent with lobular carcinoma in situ. She was seen by Dr. Emelia Loron who recommended a wire guided excisional biopsy. She had the procedure performed on 11/11/2012. The pathology revealed lobular carcinoma in situ  #2 patient was seen in the high-risk clinic. We discussed the risk reduction for future future breast cancer. She was started on tamoxifen 20 mg daily. Total of 5 years of therapy is planned.  CURRENT THERAPY:Tamoxifen 20 mg daily  INTERVAL HISTORY: Evelyn Castro 47 y.o. female returns forfollowup visit today. Overall she's doing well she's been tolerating tamoxifen very nicely without any significant problems except for some hot flashes. She denies any fevers chills night sweats headaches shortness of breath chest pains palpitations no myalgias and arthralgias no peripheral paresthesias. No vaginal bleeding no lower extremity swelling. Remainder of the 10 point review of systems is negative.  MEDICAL HISTORY: Past Medical History  Diagnosis Date  . Varicose veins   . Multiple allergies     ALLERGIES:  has No Known Allergies.  MEDICATIONS:  Current Outpatient Prescriptions  Medication Sig Dispense Refill  . DiphenhydrAMINE HCl (BENADRYL PO) Take 25 mg by mouth daily as needed. For allergy symptoms       . ibuprofen (ADVIL,MOTRIN) 200 MG tablet Take 200 mg by mouth 4 (four) times daily as needed. For menstrual cramps      . Multiple Vitamin (MULTIVITAMIN) capsule Take 1 capsule by mouth daily.        . tamoxifen (NOLVADEX) 20 MG tablet Take 20 mg by mouth daily.      Marland Kitchen triamcinolone (NASACORT) 55 MCG/ACT nasal inhaler Place 2 sprays into the nose daily.       No current facility-administered medications for this visit.    SURGICAL HISTORY:  Past Surgical History  Procedure Laterality Date  . Varicose veins  2000, 2001  . Total abdominal hysterectomy  8.27.12    DR.TF ASSISTED  . Abdominal hysterectomy  02/25/2011    Procedure: HYSTERECTOMY ABDOMINAL;  Surgeon: Trellis Paganini, MD;  Location: [facility];  Service: Gynecology;  Laterality: N/A;  . Breast biopsy Right 11/12/2012    Procedure: RIGHT BREAST WIRE LOCALIZED EXCISION ;  Surgeon: Emelia Loron, MD;  Location: [facility];  Service: General;  Laterality: Right;    REVIEW OF SYSTEMS:  A comprehensive review of systems was negative.   HEALTH MAINTENANCE:  PHYSICAL EXAMINATION: Blood pressure 111/74, pulse 76, temperature 98.3 F (36.8 C), temperature source Oral, resp. rate 20, height 5' (1.524 m), weight 123 lb 4.8 oz (55.929 kg), last menstrual period 02/17/2011. Body mass index is 24.08 kg/(m^2). ECOG PERFORMANCE STATUS: 0 - Asymptomatic   General appearance: alert, cooperative and appears stated age Neck: no adenopathy, no carotid bruit, no JVD, supple, symmetrical, trachea midline and thyroid not enlarged, symmetric, no tenderness/mass/nodules Lymph nodes: Cervical, supraclavicular, and axillary nodes normal. Resp: clear to auscultation bilaterally Back: symmetric, no curvature. ROM normal. No CVA tenderness. Cardio: regular rate and rhythm GI: soft, non-tender; bowel sounds normal; no masses,  no organomegaly Extremities: extremities normal, atraumatic, no  cyanosis or edema Neurologic: Grossly normal   LABORATORY DATA: Lab Results  Component Value Date   WBC 6.7 03/18/2013   HGB 13.3 03/18/2013   HCT 39.0 03/18/2013   MCV 88.8 03/18/2013   PLT 249 03/18/2013      Chemistry      Component  Value Date/Time   NA 141 03/18/2013 1027   NA 137 11/11/2012 1025   K 4.1 03/18/2013 1027   K 3.7 11/11/2012 1025   CL 101 11/11/2012 1025   CO2 24 03/18/2013 1027   CO2 25 11/11/2012 1025   BUN 10.7 03/18/2013 1027   BUN 7 11/11/2012 1025   CREATININE 0.9 03/18/2013 1027   CREATININE 0.79 11/11/2012 1025      Component Value Date/Time   CALCIUM 9.1 03/18/2013 1027   CALCIUM 8.9 11/11/2012 1025   ALKPHOS 51 03/18/2013 1027   AST 20 03/18/2013 1027   ALT 21 03/18/2013 1027   BILITOT 0.34 03/18/2013 1027       RADIOGRAPHIC STUDIES:  No results found.  ASSESSMENT: 47 year old female with LCIS of the right breast status post resection. Seen in the high-risk clinic back in June 2014. She was begun on tamoxifen 20 mg. She is tolerating it well no problems. We discussed further lifestyle modification including exercise eating healthy and reducing alcohol consumption as well as reducing stress. She is encouraged to continue seeing her primary care physician as well as an ophthalmologist. She should also be seen by her gynecologist on a yearly basis. She is also to report any vaginal bleeding intermittently.   PLAN:   #1 continue tamoxifen 20 mg daily.  #2 I will see the patient back in one years time   All questions were answered. The patient knows to call the clinic with any problems, questions or concerns. We can certainly see the patient much sooner if necessary.  I spent 25 minutes counseling the patient face to face. The total time spent in the appointment was 25 minutes.    Drue Second, MD Medical/Oncology [facility] [phone] (beeper) [phone] (Office)

## 2013-05-06 ENCOUNTER — Other Ambulatory Visit: Payer: Self-pay

## 2013-12-13 ENCOUNTER — Other Ambulatory Visit: Payer: Self-pay | Admitting: *Deleted

## 2013-12-13 DIAGNOSIS — Z853 Personal history of malignant neoplasm of breast: Secondary | ICD-10-CM

## 2013-12-17 ENCOUNTER — Other Ambulatory Visit: Payer: Self-pay | Admitting: Anesthesiology

## 2013-12-17 DIAGNOSIS — Z853 Personal history of malignant neoplasm of breast: Secondary | ICD-10-CM

## 2014-02-22 ENCOUNTER — Other Ambulatory Visit: Payer: Self-pay | Admitting: Oncology

## 2014-02-22 DIAGNOSIS — D0590 Unspecified type of carcinoma in situ of unspecified breast: Secondary | ICD-10-CM

## 2014-03-14 ENCOUNTER — Telehealth: Payer: Self-pay | Admitting: Oncology

## 2014-03-18 ENCOUNTER — Other Ambulatory Visit: Payer: BC Managed Care – PPO

## 2014-03-18 ENCOUNTER — Ambulatory Visit: Payer: BC Managed Care – PPO | Admitting: Oncology

## 2014-04-05 ENCOUNTER — Telehealth: Payer: Self-pay | Admitting: Hematology and Oncology

## 2014-04-05 ENCOUNTER — Ambulatory Visit (HOSPITAL_BASED_OUTPATIENT_CLINIC_OR_DEPARTMENT_OTHER): Payer: BC Managed Care – PPO | Admitting: Hematology and Oncology

## 2014-04-05 VITALS — BP 122/63 | HR 74 | Temp 98.4°F | Resp 18 | Ht 60.0 in | Wt 131.3 lb

## 2014-04-05 DIAGNOSIS — Z79810 Long term (current) use of selective estrogen receptor modulators (SERMs): Secondary | ICD-10-CM

## 2014-04-05 DIAGNOSIS — D0501 Lobular carcinoma in situ of right breast: Secondary | ICD-10-CM

## 2014-04-05 NOTE — Telephone Encounter (Signed)
per pof to sch pt appt-gaver pt copy of sch °

## 2014-04-05 NOTE — Assessment & Plan Note (Signed)
LCIS right breast: Status post lumpectomy now on breast cancer prevention with tamoxifen 20 mg daily started September 2014.  Patient is tolerating tamoxifen extremely well without any major problems or concerns. She denies any hot flashes or muscle aches or pains other than the usual symptoms. She had hysterectomy previously so she does not have any need for testing.  Surveillance: Annual mammograms and physical exams are recommended. Today's physical exam did not reveal any abnormalities.  Survivorship:Discussed the importance of physical exercise in decreasing the likelihood of breast cancer recurrence. Recommended 30 mins daily 6 days a week of either brisk walking or cycling or swimming. Encouraged patient to eat more fruits and vegetables and decrease red meat. Instructed the patient to have annual follow up with her primary care physician.  Return to clinic in one year for followup

## 2014-04-05 NOTE — Progress Notes (Signed)
Patient Care Team: [name], MD as PCP - General (Obstetrics and Gynecology)  DIAGNOSIS: 48 year old female with diagnosis of LCIS on a core needle biopsy and subsequent status post resection   PRIOR THERAPY:  #1screening mammogram and was found to have right breast calcifications. She also had left breast cysts on ultrasound. Patient went on to have a core biopsy of the right calcifications and the findings were consistent with lobular carcinoma in situ. She was seen by Dr. Rolm Bookbinder who recommended a wire guided excisional biopsy. She had the procedure performed on 11/11/2012. The pathology revealed lobular carcinoma in situ  #2 patient was seen in the high-risk clinic. We discussed the risk reduction for future future breast cancer. She was started on tamoxifen 20 mg daily. Total of 5 years of therapy is planned.   CURRENT THERAPY:Tamoxifen 20 mg daily  CHIEF COMPLIANT: Followup of LCIS  INTERVAL HISTORY: Evelyn Castro is a 48 year old Caucasian lady with above-mentioned history of LCIS involving the right breast that was detected during routine screening mammogram S. calcifications underwent wire guided biopsy and is currently on antiestrogen therapy with tamoxifen. She is tolerating it extremely well without any major problems or concerns. She complains of muscle aches and pains but she does not like it is related to her weekend gardening activities. She does not do any regular exercise. She works in a Engineer, maintenance (IT) form and stays fairly busy with that.  REVIEW OF SYSTEMS:   Constitutional: Denies fevers, chills or abnormal weight loss Eyes: Denies blurriness of vision Ears, nose, mouth, throat, and face: Denies mucositis or sore throat Respiratory: Denies cough, dyspnea or wheezes Cardiovascular: Denies palpitation, chest discomfort or lower extremity swelling Gastrointestinal:  Denies nausea, heartburn or change in bowel habits Skin: Denies abnormal skin rashes Lymphatics:  Denies new lymphadenopathy or easy bruising Neurological:Denies numbness, tingling or new weaknesses Behavioral/Psych: Mood is stable, no new changes  Breast:  denies any pain or lumps or nodules in either breasts All other systems were reviewed with the patient and are negative.  I have reviewed the past medical history, past surgical history, social history and family history with the patient and they are unchanged from previous note.  ALLERGIES:  has No Known Allergies.  MEDICATIONS:  Current Outpatient Prescriptions  Medication Sig Dispense Refill  . DiphenhydrAMINE HCl (BENADRYL PO) Take 25 mg by mouth daily as needed. For allergy symptoms       . ibuprofen (ADVIL,MOTRIN) 200 MG tablet Take 200 mg by mouth 4 (four) times daily as needed. For menstrual cramps      . Multiple Vitamin (MULTIVITAMIN) capsule Take 1 capsule by mouth daily.       . tamoxifen (NOLVADEX) 20 MG tablet Take 20 mg by mouth daily.      . tamoxifen (NOLVADEX) 20 MG tablet TAKE ONE TABLET BY MOUTH ONE TIME DAILY   90 tablet  0  . triamcinolone (NASACORT) 55 MCG/ACT nasal inhaler Place 2 sprays into the nose daily.       No current facility-administered medications for this visit.    PHYSICAL EXAMINATION: ECOG PERFORMANCE STATUS: 0 - Asymptomatic  Filed Vitals:   04/05/14 0817  BP: 122/63  Pulse: 74  Temp: 98.4 F (36.9 C)  Resp: 18   Filed Weights   04/05/14 0817  Weight: 131 lb 4.8 oz (59.557 kg)    GENERAL:alert, no distress and comfortable SKIN: skin color, texture, turgor are normal, no rashes or significant lesions EYES: normal, Conjunctiva are pink and non-injected,  sclera clear OROPHARYNX:no exudate, no erythema and lips, buccal mucosa, and tongue normal  NECK: supple, thyroid normal size, non-tender, without nodularity LYMPH:  no palpable lymphadenopathy in the cervical, axillary or inguinal LUNGS: clear to auscultation and percussion with normal breathing effort HEART: regular rate &  rhythm and no murmurs and no lower extremity edema ABDOMEN:abdomen soft, non-tender and normal bowel sounds Musculoskeletal:no cyanosis of digits and no clubbing  NEURO: alert & oriented x 3 with fluent speech, no focal motor/sensory deficits BREAST: No palpable masses or nodules in either right or left breasts. No palpable axillary supraclavicular or infraclavicular adenopathy no breast tenderness or nipple discharge.   LABORATORY DATA:  I have reviewed the data as listed   Chemistry      Component Value Date/Time   NA 141 03/18/2013 1027   NA 137 11/11/2012 1025   K 4.1 03/18/2013 1027   K 3.7 11/11/2012 1025   CL 101 11/11/2012 1025   CO2 24 03/18/2013 1027   CO2 25 11/11/2012 1025   BUN 10.7 03/18/2013 1027   BUN 7 11/11/2012 1025   CREATININE 0.9 03/18/2013 1027   CREATININE 0.79 11/11/2012 1025      Component Value Date/Time   CALCIUM 9.1 03/18/2013 1027   CALCIUM 8.9 11/11/2012 1025   ALKPHOS 51 03/18/2013 1027   AST 20 03/18/2013 1027   ALT 21 03/18/2013 1027   BILITOT 0.34 03/18/2013 1027       Lab Results  Component Value Date   WBC 6.7 03/18/2013   HGB 13.3 03/18/2013   HCT 39.0 03/18/2013   MCV 88.8 03/18/2013   PLT 249 03/18/2013   NEUTROABS 5.0 03/18/2013     RADIOGRAPHIC STUDIES: I have personally reviewed the radiology reports and agreed with their findings. No results found.   ASSESSMENT & PLAN:  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  Patient is tolerating tamoxifen extremely well without any major problems or concerns. She denies any hot flashes or muscle aches or pains other than the usual symptoms. She had hysterectomy previously so she does not have any need for testing.  Surveillance: Annual mammograms and physical exams are recommended. Today's physical exam did not reveal any abnormalities.  Survivorship:Discussed the importance of  physical exercise in decreasing the likelihood of breast cancer recurrence. Recommended 30 mins daily 6 days a week of either brisk walking or cycling or swimming. Encouraged patient to eat more fruits and vegetables and decrease red meat. Instructed the patient to have annual follow up with her primary care physician.  Return to clinic in one year for followup   No orders of the defined types were placed in this encounter.   The patient has a good understanding of the overall plan. she agrees with it. She will call with any problems that may develop before her next visit here.  I spent 15 minutes counseling the patient face to face. The total time spent in the appointment was 20 minutes and more than 50% was on counseling and review of test results    Rulon Eisenmenger, MD 04/05/2014 8:28 AM

## 2014-04-06 ENCOUNTER — Other Ambulatory Visit: Payer: Self-pay | Admitting: *Deleted

## 2014-04-06 DIAGNOSIS — D0590 Unspecified type of carcinoma in situ of unspecified breast: Secondary | ICD-10-CM

## 2014-04-06 MED ORDER — TAMOXIFEN CITRATE 20 MG PO TABS: 20.0000 mg | ORAL_TABLET | Freq: Every day | ORAL | Status: DC

## 2014-04-15 ENCOUNTER — Other Ambulatory Visit: Payer: Self-pay

## 2014-06-11 ENCOUNTER — Emergency Department (HOSPITAL_COMMUNITY)
Admission: EM | Admit: 2014-06-11 | Discharge: 2014-06-11 | Disposition: A | Discharge status: Home / Self Care | Payer: BC Managed Care – PPO | Source: Home / Self Care | Attending: Family Medicine | Admitting: Family Medicine

## 2014-06-11 ENCOUNTER — Ambulatory Visit (HOSPITAL_COMMUNITY): Payer: BC Managed Care – PPO | Attending: Family Medicine

## 2014-06-11 ENCOUNTER — Encounter (HOSPITAL_COMMUNITY): Payer: Self-pay | Admitting: Emergency Medicine

## 2014-06-11 DIAGNOSIS — R509 Fever, unspecified: Secondary | ICD-10-CM | POA: Diagnosis present

## 2014-06-11 DIAGNOSIS — R05 Cough: Secondary | ICD-10-CM | POA: Diagnosis not present

## 2014-06-11 DIAGNOSIS — J189 Pneumonia, unspecified organism: Secondary | ICD-10-CM

## 2014-06-11 MED ORDER — MOXIFLOXACIN HCL 400 MG PO TABS: 400.0000 mg | ORAL_TABLET | Freq: Every day | ORAL | Status: DC

## 2014-06-11 MED ORDER — CEFTRIAXONE SODIUM 1 G IJ SOLR
INTRAMUSCULAR | Status: AC
  Filled 2014-06-11: qty 10

## 2014-06-11 MED ORDER — CEFTRIAXONE SODIUM 1 G IJ SOLR
1.0000 g | Freq: Once | INTRAMUSCULAR | Status: AC
  Administered 2014-06-11: 1 g via INTRAMUSCULAR

## 2014-06-11 MED ORDER — LIDOCAINE HCL (PF) 1 % IJ SOLN
INTRAMUSCULAR | Status: AC
  Filled 2014-06-11: qty 5

## 2014-06-11 NOTE — Discharge Instructions (Signed)
Rest, drink plenty of fluids, take all of medicine, start on sun, see your doctor if further problems.

## 2014-06-11 NOTE — ED Notes (Signed)
Went over discharge instructions with pt. Pt verbalized understanding of prescriptions and follow up care.

## 2014-06-11 NOTE — ED Provider Notes (Signed)
CSN: [id number]     Arrival date & time 06/11/14  0919 History   First MD Initiated Contact with Patient 06/11/14 [phone]     Chief Complaint  Patient presents with  . Fever  . Cough   (Consider location/radiation/quality/duration/timing/severity/associated sxs/prior Treatment) Patient is a 48 y.o. female presenting with fever. The history is provided by the patient.  Fever Severity:  Moderate Onset quality:  Sudden Duration:  4 days Progression:  Unchanged Chronicity:  New Relieved by:  None tried Worsened by:  Nothing tried Ineffective treatments:  None tried Associated symptoms: chills, congestion, cough, nausea and rhinorrhea   Associated symptoms: no diarrhea, no dysuria, no rash and no sore throat     Past Medical History  Diagnosis Date  . Varicose veins   . Multiple allergies    Past Surgical History  Procedure Laterality Date  . Varicose veins  2000, 2001  . Total abdominal hysterectomy  8.27.12    DR.TF ASSISTED  . Abdominal hysterectomy  02/25/2011    Procedure: HYSTERECTOMY ABDOMINAL;  Surgeon: Bennetta Laos, MD;  Location: [facility];  Service: Gynecology;  Laterality: N/A;  . Breast biopsy Right 11/12/2012    Procedure: RIGHT BREAST WIRE LOCALIZED EXCISION ;  Surgeon: Rolm Bookbinder, MD;  Location: [facility];  Service: General;  Laterality: Right;   Family History  Problem Relation Age of Onset  . Hypertension Mother   . Arthritis Mother   . Hypertension Father   . Breast cancer Maternal Aunt     age 82  . Uterine cancer Maternal Grandmother    History  Substance Use Topics  . Smoking status: Never Smoker   . Smokeless tobacco: Not on file  . Alcohol Use: 2.0 oz/week    4 Not specified per week     Comment:  daily   OB History    Gravida Para Term Preterm AB TAB SAB Ectopic Multiple Living   0              Review of Systems  Constitutional: Positive for fever and chills.  HENT: Positive for congestion and rhinorrhea. Negative  for sore throat.   Respiratory: Positive for cough. Negative for shortness of breath and wheezing.   Gastrointestinal: Positive for nausea. Negative for diarrhea.  Genitourinary: Negative for dysuria.  Skin: Negative for rash.    Allergies  Review of patient's allergies indicates no known allergies.  Home Medications   Prior to Admission medications   Medication Sig Start Date End Date Taking? Authorizing Provider  tamoxifen (NOLVADEX) 20 MG tablet Take 1 tablet (20 mg total) by mouth daily. 04/06/14  Yes Rulon Eisenmenger, MD  DiphenhydrAMINE HCl (BENADRYL PO) Take 25 mg by mouth daily as needed. For allergy symptoms     Historical Provider, MD  ibuprofen (ADVIL,MOTRIN) 200 MG tablet Take 200 mg by mouth 4 (four) times daily as needed. For menstrual cramps    Historical Provider, MD  moxifloxacin (AVELOX) 400 MG tablet Take 1 tablet (400 mg total) by mouth daily. 06/11/14   Billy Fischer, MD  Multiple Vitamin (MULTIVITAMIN) capsule Take 1 capsule by mouth daily.     Historical Provider, MD  tamoxifen (NOLVADEX) 20 MG tablet TAKE ONE TABLET BY MOUTH ONE TIME DAILY  02/22/14   Minette Headland, NP  triamcinolone (NASACORT) 55 MCG/ACT nasal inhaler Place 2 sprays into the nose daily.    Historical Provider, MD   BP 112/72 mmHg  Pulse 128  Temp(Src) 102.9 F (39.4 C) (  Oral)  Resp 18  SpO2 95%  LMP 02/17/2011 Physical Exam  Constitutional: She is oriented to person, place, and time. She appears well-developed and well-nourished. No distress.  HENT:  Head: Normocephalic.  Right Ear: External ear normal.  Left Ear: External ear normal.  Mouth/Throat: Oropharynx is clear and moist.  Neck: Normal range of motion. Neck supple.  Cardiovascular: Normal heart sounds and intact distal pulses.   Pulmonary/Chest: Effort normal. She has decreased breath sounds in the right middle field and the right lower field. She has no wheezes. She has rales in the right middle field and the right lower  field.  Abdominal: Soft. Bowel sounds are normal.  Lymphadenopathy:    She has no cervical adenopathy.  Neurological: She is alert and oriented to person, place, and time.  Skin: Skin is warm and dry.  Nursing note and vitals reviewed.   ED Course  Procedures (including critical care time) Labs Review Labs Reviewed - No data to display  Imaging Review Dg Chest 2 View  06/11/2014   CLINICAL DATA:  Dry cough. Fever of 103. Mid sternal pain for 3 days.  EXAM: CHEST  2 VIEW  COMPARISON:  None.  FINDINGS: There is consolidation the right lower lobe consistent with pneumonia. The remainder of the lungs is clear. No pleural effusion or pneumothorax.  Heart, mediastinum and hila are unremarkable. Bony thorax is intact.  IMPRESSION: Right lower lobe pneumonia.   Electronically Signed   By: Lajean Manes M.D.   On: 06/11/2014 11:13    X-rays reviewed and report per radiologist.  MDM   1. CAP (community acquired pneumonia)        Billy Fischer, MD 06/11/14 1124

## 2014-06-11 NOTE — ED Notes (Signed)
Pt states that she has been coughing and fever since 06/08/2014. Pt states that she has had chills and body aches pt is in no acute distress at this time.

## 2014-09-22 ENCOUNTER — Ambulatory Visit (INDEPENDENT_AMBULATORY_CARE_PROVIDER_SITE_OTHER): Payer: BLUE CROSS/BLUE SHIELD | Admitting: Gynecology

## 2014-09-22 ENCOUNTER — Encounter: Payer: Self-pay | Admitting: Gynecology

## 2014-09-22 ENCOUNTER — Other Ambulatory Visit (HOSPITAL_COMMUNITY)
Admission: RE | Admit: 2014-09-22 | Discharge: 2014-09-22 | Disposition: A | Discharge status: Home / Self Care | Payer: BLUE CROSS/BLUE SHIELD | Source: Ambulatory Visit | Attending: Gynecology | Admitting: Gynecology

## 2014-09-22 VITALS — BP 122/80 | Ht 60.0 in | Wt 127.0 lb

## 2014-09-22 DIAGNOSIS — Z01419 Encounter for gynecological examination (general) (routine) without abnormal findings: Secondary | ICD-10-CM | POA: Insufficient documentation

## 2014-09-22 DIAGNOSIS — C50911 Malignant neoplasm of unspecified site of right female breast: Secondary | ICD-10-CM

## 2014-09-22 LAB — LIPID PANEL
CHOL/HDL RATIO: 2.7 ratio
CHOLESTEROL: 189 mg/dL (ref 0–200)
HDL: 69 mg/dL (ref >=46)
LDL Cholesterol: 98 mg/dL (ref 0–99)
Triglycerides: 109 mg/dL (ref <150)
VLDL: 22 mg/dL (ref 0–40)

## 2014-09-22 LAB — TSH: TSH: 1.67 u[IU]/mL (ref 0.350–4.500)

## 2014-09-22 LAB — CBC WITH DIFFERENTIAL/PLATELET
BASOS ABS: 0.1 10*3/uL (ref 0.0–0.1)
BASOS PCT: 1 % (ref 0–1)
EOS PCT: 1 % (ref 0–5)
Eosinophils Absolute: 0.1 10*3/uL (ref 0.0–0.7)
HCT: 40.1 % (ref 36.0–46.0)
HEMOGLOBIN: 13.5 g/dL (ref 12.0–15.0)
Lymphocytes Relative: 21 % (ref 12–46)
Lymphs Abs: 1.3 10*3/uL (ref 0.7–4.0)
MCH: 29.9 pg (ref 26.0–34.0)
MCHC: 33.7 g/dL (ref 30.0–36.0)
MCV: 88.7 fL (ref 78.0–100.0)
MONO ABS: 0.3 10*3/uL (ref 0.1–1.0)
MPV: 8.8 fL (ref 8.6–12.4)
Monocytes Relative: 5 % (ref 3–12)
Neutro Abs: 4.4 10*3/uL (ref 1.7–7.7)
Neutrophils Relative %: 72 % (ref 43–77)
PLATELETS: 261 10*3/uL (ref 150–400)
RBC: 4.52 MIL/uL (ref 3.87–5.11)
RDW: 13.9 % (ref 11.5–15.5)
WBC: 6.1 10*3/uL (ref 4.0–10.5)

## 2014-09-22 LAB — COMPREHENSIVE METABOLIC PANEL
ALBUMIN: 4.5 g/dL (ref 3.5–5.2)
ALT: 19 U/L (ref 0–35)
AST: 19 U/L (ref 0–37)
Alkaline Phosphatase: 54 U/L (ref 39–117)
BILIRUBIN TOTAL: 0.4 mg/dL (ref 0.2–1.2)
BUN: 13 mg/dL (ref 6–23)
CALCIUM: 9.5 mg/dL (ref 8.4–10.5)
CHLORIDE: 100 meq/L (ref 96–112)
CO2: 26 meq/L (ref 19–32)
Creat: 0.79 mg/dL (ref 0.50–1.10)
GLUCOSE: 90 mg/dL (ref 70–99)
POTASSIUM: 4.6 meq/L (ref 3.5–5.3)
SODIUM: 138 meq/L (ref 135–145)
TOTAL PROTEIN: 6.9 g/dL (ref 6.0–8.3)

## 2014-09-22 NOTE — Progress Notes (Addendum)
Evelyn Castro 08-15-1965 [id number]        49 y.o.  G0P0 for annual exam.  Several issues noted below.  Past medical history,surgical history, problem list, medications, allergies, family history and social history were all reviewed and documented as reviewed in the EPIC chart.  ROS:  Performed with pertinent positives and negatives included in the history, assessment and plan.   Additional significant findings :  none   Exam: Evelyn Castro Vitals:   09/22/14 1150  BP: 122/80  Height: 5' (1.524 m)  Weight: 127 lb (57.607 kg)   General appearance:  Normal affect, orientation and appearance. Skin: Grossly normal HEENT: Without gross lesions.  No cervical or supraclavicular adenopathy. Thyroid normal.  Lungs:  Clear without wheezing, rales or rhonchi Cardiac: RR, without RMG Abdominal:  Soft, nontender, without masses, guarding, rebound, organomegaly or hernia Breasts:  Examined lying and sitting without masses, retractions, discharge or axillary adenopathy.  Well-healed right lumpectomy scar. Pelvic:  Ext/BUS/vagina normal. Pap smear of cuff done  Adnexa  Without masses or tenderness    Anus and perineum  Normal   Rectovaginal  Normal sphincter tone without palpated masses or tenderness.    Assessment/Plan:  48 y.o. G0P0 female for annual exam.   1. Status post TAH for leiomyoma and endometriosis 2012. Occasional hot flashes but not consistent. No vaginal dryness. Continue to monitor. 2. History of lobular carcinoma in situ right breast. Status post lumpectomy. On tamoxifen. Doing well. Actively followed by oncology. Exam today NED. Mammography 11/2014. SBE monthly reviewed. Repeat mammogram this coming year. 3. Pap smear 2012. Pap smear of vaginal cuff today. No history of significantly abnormal Pap smears. Status post hysterectomy for benign indications. Options to stop screening altogether or less frequent screening intervals reviewed. Will readdress on an annual  basis. 4. Health maintenance. Reminded patient that she will need to schedule colonoscopy after she turns 49. Baseline CBC comprehensive metabolic panel lipid profile urinalysis TSH vitamin D ordered. Follow up in one year, sooner as needed.   Addendum 10/19/2014. After visit realize did not document the patient had slight systolic ejection murmur. Patient's been told this "her entire life". Has not had formal evaluation such as echocardiogram. Called the patient in follow up to recommend cardiology evaluation and patient declined again stating that she has had this her whole life.  [name] MD, 12:33 PM 09/22/2014

## 2014-09-22 NOTE — Addendum Note (Signed)
Addended by: Burnett Kanaris on: 09/22/2014 12:44 PM   Modules accepted: Orders

## 2014-09-22 NOTE — Patient Instructions (Signed)
You may obtain a copy of any labs that were done today by logging onto MyChart as outlined in the instructions provided with your AVS (after visit summary). The office will not call with normal lab results but certainly if there are any significant abnormalities then we will contact you.   Health Maintenance, Female A healthy lifestyle and preventative care can promote health and wellness.  Maintain regular health, dental, and eye exams.  Eat a healthy diet. Foods like vegetables, fruits, whole grains, low-fat dairy products, and lean protein foods contain the nutrients you need without too many calories. Decrease your intake of foods high in solid fats, added sugars, and salt. Get information about a proper diet from your caregiver, if necessary.  Regular physical exercise is one of the most important things you can do for your health. Most adults should get at least 150 minutes of moderate-intensity exercise (any activity that increases your heart rate and causes you to sweat) each week. In addition, most adults need muscle-strengthening exercises on 2 or more days a week.   Maintain a healthy weight. The body mass index (BMI) is a screening tool to identify possible weight problems. It provides an estimate of body fat based on height and weight. Your caregiver can help determine your BMI, and can help you achieve or maintain a healthy weight. For adults 20 years and older:  A BMI below 18.5 is considered underweight.  A BMI of 18.5 to 24.9 is normal.  A BMI of 25 to 29.9 is considered overweight.  A BMI of 30 and above is considered obese.  Maintain normal blood lipids and cholesterol by exercising and minimizing your intake of saturated fat. Eat a balanced diet with plenty of fruits and vegetables. Blood tests for lipids and cholesterol should begin at age 61 and be repeated every 5 years. If your lipid or cholesterol levels are high, you are over 50, or you are a high risk for heart  disease, you may need your cholesterol levels checked more frequently.Ongoing high lipid and cholesterol levels should be treated with medicines if diet and exercise are not effective.  If you smoke, find out from your caregiver how to quit. If you do not use tobacco, do not start.  Lung cancer screening is recommended for adults aged 33 80 years who are at high risk for developing lung cancer because of a history of smoking. Yearly low-dose computed tomography (CT) is recommended for people who have at least a 30-pack-year history of smoking and are a current smoker or have quit within the past 15 years. A pack year of smoking is smoking an average of 1 pack of cigarettes a day for 1 year (for example: 1 pack a day for 30 years or 2 packs a day for 15 years). Yearly screening should continue until the smoker has stopped smoking for at least 15 years. Yearly screening should also be stopped for people who develop a health problem that would prevent them from having lung cancer treatment.  If you are pregnant, do not drink alcohol. If you are breastfeeding, be very cautious about drinking alcohol. If you are not pregnant and choose to drink alcohol, do not exceed 1 drink per day. One drink is considered to be 12 ounces (355 mL) of beer, 5 ounces (148 mL) of wine, or 1.5 ounces (44 mL) of liquor.  Avoid use of street drugs. Do not share needles with anyone. Ask for help if you need support or instructions about stopping  the use of drugs.  High blood pressure causes heart disease and increases the risk of stroke. Blood pressure should be checked at least every 1 to 2 years. Ongoing high blood pressure should be treated with medicines, if weight loss and exercise are not effective.  If you are 59 to 50 years old, ask your caregiver if you should take aspirin to prevent strokes.  Diabetes screening involves taking a blood sample to check your fasting blood sugar level. This should be done once every 3  years, after age 91, if you are within normal weight and without risk factors for diabetes. Testing should be considered at a younger age or be carried out more frequently if you are overweight and have at least 1 risk factor for diabetes.  Breast cancer screening is essential preventative care for women. You should practice "breast self-awareness." This means understanding the normal appearance and feel of your breasts and may include breast self-examination. Any changes detected, no matter how small, should be reported to a caregiver. Women in their 66s and 30s should have a clinical breast exam (CBE) by a caregiver as part of a regular health exam every 1 to 3 years. After age 101, women should have a CBE every year. Starting at age 100, women should consider having a mammogram (breast X-ray) every year. Women who have a family history of breast cancer should talk to their caregiver about genetic screening. Women at a high risk of breast cancer should talk to their caregiver about having an MRI and a mammogram every year.  Breast cancer gene (BRCA)-related cancer risk assessment is recommended for women who have family members with BRCA-related cancers. BRCA-related cancers include breast, ovarian, tubal, and peritoneal cancers. Having family members with these cancers may be associated with an increased risk for harmful changes (mutations) in the breast cancer genes BRCA1 and BRCA2. Results of the assessment will determine the need for genetic counseling and BRCA1 and BRCA2 testing.  The Pap test is a screening test for cervical cancer. Women should have a Pap test starting at age 57. Between ages 25 and 35, Pap tests should be repeated every 2 years. Beginning at age 37, you should have a Pap test every 3 years as long as the past 3 Pap tests have been normal. If you had a hysterectomy for a problem that was not cancer or a condition that could lead to cancer, then you no longer need Pap tests. If you are  between ages 50 and 76, and you have had normal Pap tests going back 10 years, you no longer need Pap tests. If you have had past treatment for cervical cancer or a condition that could lead to cancer, you need Pap tests and screening for cancer for at least 20 years after your treatment. If Pap tests have been discontinued, risk factors (such as a new sexual partner) need to be reassessed to determine if screening should be resumed. Some women have medical problems that increase the chance of getting cervical cancer. In these cases, your caregiver may recommend more frequent screening and Pap tests.  The human papillomavirus (HPV) test is an additional test that may be used for cervical cancer screening. The HPV test looks for the virus that can cause the cell changes on the cervix. The cells collected during the Pap test can be tested for HPV. The HPV test could be used to screen women aged 44 years and older, and should be used in women of any age  who have unclear Pap test results. After the age of 55, women should have HPV testing at the same frequency as a Pap test.  Colorectal cancer can be detected and often prevented. Most routine colorectal cancer screening begins at the age of 44 and continues through age 20. However, your caregiver may recommend screening at an earlier age if you have risk factors for colon cancer. On a yearly basis, your caregiver may provide home test kits to check for hidden blood in the stool. Use of a small camera at the end of a tube, to directly examine the colon (sigmoidoscopy or colonoscopy), can detect the earliest forms of colorectal cancer. Talk to your caregiver about this at age 86, when routine screening begins. Direct examination of the colon should be repeated every 5 to 10 years through age 13, unless early forms of pre-cancerous polyps or small growths are found.  Hepatitis C blood testing is recommended for all people born from [birth year] through 1966 and any  individual with known risks for hepatitis C.  Practice safe sex. Use condoms and avoid high-risk sexual practices to reduce the spread of sexually transmitted infections (STIs). Sexually active women aged 36 and younger should be checked for Chlamydia, which is a common sexually transmitted infection. Older women with new or multiple partners should also be tested for Chlamydia. Testing for other STIs is recommended if you are sexually active and at increased risk.  Osteoporosis is a disease in which the bones lose minerals and strength with aging. This can result in serious bone fractures. The risk of osteoporosis can be identified using a bone density scan. Women ages 20 and over and women at risk for fractures or osteoporosis should discuss screening with their caregivers. Ask your caregiver whether you should be taking a calcium supplement or vitamin D to reduce the rate of osteoporosis.  Menopause can be associated with physical symptoms and risks. Hormone replacement therapy is available to decrease symptoms and risks. You should talk to your caregiver about whether hormone replacement therapy is right for you.  Use sunscreen. Apply sunscreen liberally and repeatedly throughout the day. You should seek shade when your shadow is shorter than you. Protect yourself by wearing long sleeves, pants, a wide-brimmed hat, and sunglasses year round, whenever you are outdoors.  Notify your caregiver of new moles or changes in moles, especially if there is a change in shape or color. Also notify your caregiver if a mole is larger than the size of a pencil eraser.  Stay current with your immunizations. Document Released: 12/31/2010 Document Revised: 10/12/2012 Document Reviewed: 12/31/2010 [facility] Patient Information 2014 [place].

## 2014-09-23 LAB — URINALYSIS W MICROSCOPIC + REFLEX CULTURE
BACTERIA UA: NONE SEEN
BILIRUBIN URINE: NEGATIVE
CRYSTALS: NONE SEEN
Casts: NONE SEEN
Glucose, UA: NEGATIVE mg/dL
HGB URINE DIPSTICK: NEGATIVE
KETONES UR: NEGATIVE mg/dL
Leukocytes, UA: NEGATIVE
Nitrite: NEGATIVE
PROTEIN: NEGATIVE mg/dL
SPECIFIC GRAVITY, URINE: 1.005 (ref 1.005–1.030)
Squamous Epithelial / LPF: NONE SEEN
UROBILINOGEN UA: 0.2 mg/dL (ref 0.0–1.0)
pH: 6 (ref 5.0–8.0)

## 2014-09-23 LAB — VITAMIN D 25 HYDROXY (VIT D DEFICIENCY, FRACTURES): Vit D, 25-Hydroxy: 30 ng/mL (ref 30–100)

## 2014-09-26 ENCOUNTER — Telehealth: Payer: Self-pay | Admitting: *Deleted

## 2014-09-26 NOTE — Telephone Encounter (Signed)
-----   Message from [name], MD sent at 09/23/2014  3:45 PM EDT ----- Check with patient.  I think she is the patient I told had a slight murmer I wanted checked out.  If So recommend appt with cardiologist i.e. Dr Wynonia Lawman or [facility] cardiologist reference murmer evaluation.  Let me know because I will have to addend her note.

## 2014-09-26 NOTE — Telephone Encounter (Signed)
Left message for pt to call.

## 2014-09-27 LAB — CYTOLOGY - PAP

## 2014-10-19 NOTE — Telephone Encounter (Signed)
Dr.Fontaine I spoke with patient regarding the referral for cardiologist and pt declined to have this scheduled. States she was told this all her life, she did write the names of doctors and said she will call herself if she changes her mind.

## 2014-10-19 NOTE — Telephone Encounter (Signed)
Left message for pt to call again. 

## 2014-12-19 ENCOUNTER — Encounter: Payer: Self-pay | Admitting: Gynecology

## 2014-12-26 ENCOUNTER — Encounter: Payer: Self-pay | Admitting: Gynecology

## 2014-12-26 ENCOUNTER — Ambulatory Visit (INDEPENDENT_AMBULATORY_CARE_PROVIDER_SITE_OTHER): Payer: BLUE CROSS/BLUE SHIELD | Admitting: Gynecology

## 2014-12-26 VITALS — BP 134/80

## 2014-12-26 DIAGNOSIS — N3000 Acute cystitis without hematuria: Secondary | ICD-10-CM | POA: Diagnosis not present

## 2014-12-26 LAB — URINALYSIS W MICROSCOPIC + REFLEX CULTURE
BILIRUBIN URINE: NEGATIVE
CRYSTALS: NONE SEEN
Casts: NONE SEEN
GLUCOSE, UA: NEGATIVE mg/dL
KETONES UR: NEGATIVE mg/dL
Nitrite: POSITIVE — AB
PROTEIN: NEGATIVE mg/dL
Specific Gravity, Urine: <1.005 — ABNORMAL LOW (ref 1.005–1.030)
Urobilinogen, UA: 0.2 mg/dL (ref 0.0–1.0)
pH: 5.5 (ref 5.0–8.0)

## 2014-12-26 MED ORDER — SULFAMETHOXAZOLE-TRIMETHOPRIM 800-160 MG PO TABS: 1.0000 | ORAL_TABLET | Freq: Two times a day (BID) | ORAL | Status: DC

## 2014-12-26 NOTE — Patient Instructions (Signed)
Take antibiotic twice daily for 3 days. Follow up if symptoms persist, worsen or recur.  Urinary Tract Infection Urinary tract infections (UTIs) can develop anywhere along your urinary tract. Your urinary tract is your body's drainage system for removing wastes and extra water. Your urinary tract includes two kidneys, two ureters, a bladder, and a urethra. Your kidneys are a pair of bean-shaped organs. Each kidney is about the size of your fist. They are located below your ribs, one on each side of your spine. CAUSES Infections are caused by microbes, which are microscopic organisms, including fungi, viruses, and bacteria. These organisms are so small that they can only be seen through a microscope. Bacteria are the microbes that most commonly cause UTIs. SYMPTOMS  Symptoms of UTIs may vary by age and gender of the patient and by the location of the infection. Symptoms in young women typically include a frequent and intense urge to urinate and a painful, burning feeling in the bladder or urethra during urination. Older women and men are more likely to be tired, shaky, and weak and have muscle aches and abdominal pain. A fever may mean the infection is in your kidneys. Other symptoms of a kidney infection include pain in your back or sides below the ribs, nausea, and vomiting. DIAGNOSIS To diagnose a UTI, your caregiver will ask you about your symptoms. Your caregiver also will ask to provide a urine sample. The urine sample will be tested for bacteria and white blood cells. White blood cells are made by your body to help fight infection. TREATMENT  Typically, UTIs can be treated with medication. Because most UTIs are caused by a bacterial infection, they usually can be treated with the use of antibiotics. The choice of antibiotic and length of treatment depend on your symptoms and the type of bacteria causing your infection. HOME CARE INSTRUCTIONS  If you were prescribed antibiotics, take them exactly  as your caregiver instructs you. Finish the medication even if you feel better after you have only taken some of the medication.  Drink enough water and fluids to keep your urine clear or pale yellow.  Avoid caffeine, tea, and carbonated beverages. They tend to irritate your bladder.  Empty your bladder often. Avoid holding urine for long periods of time.  Empty your bladder before and after sexual intercourse.  After a bowel movement, women should cleanse from front to back. Use each tissue only once. SEEK MEDICAL CARE IF:   You have back pain.  You develop a fever.  Your symptoms do not begin to resolve within 3 days. SEEK IMMEDIATE MEDICAL CARE IF:   You have severe back pain or lower abdominal pain.  You develop chills.  You have nausea or vomiting.  You have continued burning or discomfort with urination. MAKE SURE YOU:   Understand these instructions.  Will watch your condition.  Will get help right away if you are not doing well or get worse. Document Released: 03/27/2005 Document Revised: 12/17/2011 Document Reviewed: 07/26/2011 [facility] Patient Information 2015 [place], [place]. This information is not intended to replace advice given to you by your health care provider. Make sure you discuss any questions you have with your health care provider.

## 2014-12-26 NOTE — Addendum Note (Signed)
Addended by: Nelva Nay on: 12/26/2014 10:39 AM   Modules accepted: Orders

## 2014-12-26 NOTE — Progress Notes (Signed)
Evelyn Castro April 20, 1966 [id number]        49 y.o.  G0P0 Presents with one-day history of frequency, dysuria and low-grade temperature in the 99 range. No low back pain or urgency. No vaginal discharge, nausea vomiting diarrhea constipation. No history of UTIs in the past.  Past medical history,surgical history, problem list, medications, allergies, family history and social history were all reviewed and documented in the EPIC chart.  Directed ROS with pertinent positives and negatives documented in the history of present illness/assessment and plan.  Exam: Filed Vitals:   12/26/14 0916  BP: 134/80   General appearance:  Normal Spine straight without CVA tenderness. Abdomen soft, mild suprapubic tenderness, without masses, guarding, rebound  Assessment/Plan:  49 y.o. G0P0 with history, exam and urinalysis consistent with UTI. Treat with Septra DS 1 by mouth twice a day 3 days. Follow up if symptoms persist, worsen or recur.    [name] MD, 9:33 AM 12/26/2014

## 2014-12-28 LAB — URINE CULTURE

## 2015-04-13 ENCOUNTER — Other Ambulatory Visit: Payer: Self-pay | Admitting: Hematology and Oncology

## 2015-04-13 DIAGNOSIS — D0500 Lobular carcinoma in situ of unspecified breast: Secondary | ICD-10-CM

## 2015-04-16 NOTE — Assessment & Plan Note (Signed)
LCIS right breast: Status post lumpectomy now on breast cancer prevention with tamoxifen 20 mg daily started September 2014.  Patient is tolerating tamoxifen extremely well without any major problems or concerns. She denies any hot flashes or muscle aches or pains other than the usual symptoms. She had hysterectomy previously so she does not have any need for testing.  Surveillance: Annual mammograms and physical exams are recommended. Today's physical exam did not reveal any abnormalities.  Survivorship:encouraged her to stay active.  Return to clinic in one year for followup

## 2015-04-17 ENCOUNTER — Ambulatory Visit (HOSPITAL_BASED_OUTPATIENT_CLINIC_OR_DEPARTMENT_OTHER): Payer: BLUE CROSS/BLUE SHIELD | Admitting: Hematology and Oncology

## 2015-04-17 ENCOUNTER — Telehealth: Payer: Self-pay | Admitting: Hematology and Oncology

## 2015-04-17 ENCOUNTER — Encounter: Payer: Self-pay | Admitting: Hematology and Oncology

## 2015-04-17 VITALS — BP 139/56 | HR 86 | Temp 98.5°F | Resp 18 | Ht 60.0 in | Wt 131.5 lb

## 2015-04-17 DIAGNOSIS — Z86000 Personal history of in-situ neoplasm of breast: Secondary | ICD-10-CM | POA: Diagnosis not present

## 2015-04-17 DIAGNOSIS — D0500 Lobular carcinoma in situ of unspecified breast: Secondary | ICD-10-CM

## 2015-04-17 DIAGNOSIS — Z79810 Long term (current) use of selective estrogen receptor modulators (SERMs): Secondary | ICD-10-CM | POA: Diagnosis not present

## 2015-04-17 DIAGNOSIS — D0501 Lobular carcinoma in situ of right breast: Secondary | ICD-10-CM

## 2015-04-17 MED ORDER — TAMOXIFEN CITRATE 20 MG PO TABS: 20.0000 mg | ORAL_TABLET | Freq: Every day | ORAL | Status: DC

## 2015-04-17 NOTE — Addendum Note (Signed)
Addended by: Prentiss Bells on: 04/17/2015 09:13 AM   Modules accepted: Medications

## 2015-04-17 NOTE — Progress Notes (Signed)
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 She also had left breast cysts on ultrasound. Patient went on to have a core biopsy of the right calcifications and the findings were consistent with lobular carcinoma in situ. She was seen by Dr. Rolm Bookbinder who recommended a wire guided excisional biopsy. She had the procedure performed on 11/11/2012. The pathology revealed lobular carcinoma in situ  #2 patient was seen in the high-risk clinic. We discussed the risk reduction for future future breast cancer. She was started on tamoxifen 20 mg daily. Total of 5 years of therapy is planned.   CURRENT THERAPY:Tamoxifen 20 mg daily  CHIEF COMPLIANT: Followup of LCIS  INTERVAL HISTORY: Evelyn Castro is a 49 year old with above-mentioned history of LCIS currently on risk reduction therapy with tamoxifen. She is tolerating it extremely well. She denies any hot flashes other than when she has a glass of wine. Denies any myalgias. Denies any vaginal bleeding.  REVIEW OF SYSTEMS:   Constitutional: Denies fevers, chills or abnormal weight loss Eyes: Denies blurriness of vision Ears, nose, mouth, throat, and face: Denies mucositis or sore throat Respiratory: Denies cough, dyspnea or wheezes Cardiovascular: Denies palpitation, chest discomfort or lower extremity swelling Gastrointestinal:  Denies nausea, heartburn or change in bowel habits Skin: Denies abnormal skin rashes Lymphatics: Denies new lymphadenopathy or easy bruising Neurological:Denies numbness, tingling or new weaknesses Behavioral/Psych: Mood is stable, no new changes  Breast:  denies any pain or lumps or nodules in either breasts All other systems were reviewed with the patient and are  negative.  I have reviewed the past medical history, past surgical history, social history and family history with the patient and they are unchanged from previous note.  ALLERGIES:  has No Known Allergies.  MEDICATIONS:  Current Outpatient Prescriptions  Medication Sig Dispense Refill  . Cholecalciferol (VITAMIN D PO) Take by mouth.    . DiphenhydrAMINE HCl (BENADRYL PO) Take 25 mg by mouth daily as needed. For allergy symptoms     . ibuprofen (ADVIL,MOTRIN) 200 MG tablet Take 200 mg by mouth 4 (four) times daily as needed. For menstrual cramps    . Multiple Vitamin (MULTIVITAMIN) capsule Take 1 capsule by mouth daily.     Marland Kitchen sulfamethoxazole-trimethoprim (BACTRIM DS,SEPTRA DS) 800-160 MG per tablet Take 1 tablet by mouth 2 (two) times daily. 6 tablet 0  . tamoxifen (NOLVADEX) 20 MG tablet TAKE ONE TABLET BY MOUTH ONE TIME DAILY 90 tablet 3   No current facility-administered medications for this visit.    PHYSICAL EXAMINATION: ECOG PERFORMANCE STATUS: 1 - Symptomatic but completely ambulatory  Filed Vitals:   04/17/15 0833  BP: 139/56  Pulse: 86  Temp: 98.5 F (36.9 C)  Resp: 18   Filed Weights   04/17/15 0833  Weight: 131 lb 8 oz (59.648 kg)    GENERAL:alert, no distress and comfortable SKIN: skin color, texture, turgor are normal, no rashes or significant lesions EYES: normal, Conjunctiva are pink and non-injected, sclera clear OROPHARYNX:no exudate, no erythema and lips, buccal mucosa, and tongue normal  NECK: supple, thyroid normal size, non-tender, without nodularity LYMPH:  no palpable lymphadenopathy in the cervical, axillary or inguinal LUNGS: clear to auscultation and percussion with normal breathing effort HEART: regular rate & rhythm and no murmurs and no  lower extremity edema ABDOMEN:abdomen soft, non-tender and normal bowel sounds Musculoskeletal:no cyanosis of digits and no clubbing  NEURO: alert & oriented x 3 with fluent speech, no focal motor/sensory  deficits BREAST: No palpable masses or nodules in either right or left breasts. No palpable axillary supraclavicular or infraclavicular adenopathy no breast tenderness or nipple discharge. (exam performed in the presence of a chaperone)  LABORATORY DATA:  I have reviewed the data as listed   Chemistry      Component Value Date/Time   NA 138 09/22/2014 1241   NA 141 03/18/2013 1027   K 4.6 09/22/2014 1241   K 4.1 03/18/2013 1027   CL 100 09/22/2014 1241   CO2 26 09/22/2014 1241   CO2 24 03/18/2013 1027   BUN 13 09/22/2014 1241   BUN 10.7 03/18/2013 1027   CREATININE 0.79 09/22/2014 1241   CREATININE 0.9 03/18/2013 1027   CREATININE 0.79 11/11/2012 1025      Component Value Date/Time   CALCIUM 9.5 09/22/2014 1241   CALCIUM 9.1 03/18/2013 1027   ALKPHOS 54 09/22/2014 1241   ALKPHOS 51 03/18/2013 1027   AST 19 09/22/2014 1241   AST 20 03/18/2013 1027   ALT 19 09/22/2014 1241   ALT 21 03/18/2013 1027   BILITOT 0.4 09/22/2014 1241   BILITOT 0.34 03/18/2013 1027       Lab Results  Component Value Date   WBC 6.1 09/22/2014   HGB 13.5 09/22/2014   HCT 40.1 09/22/2014   MCV 88.7 09/22/2014   PLT 261 09/22/2014   NEUTROABS 4.4 09/22/2014   ASSESSMENT & PLAN:  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 Year Tamoxifen toxicities: Patient is tolerating tamoxifen extremely well without any major problems or concerns. She denies any hot flashes or muscle aches or pains other than the usual symptoms. She had hysterectomy previously so she does not have any need for testing.  Surveillance: Annual mammograms and physical exams are recommended. She gets those every year in March of every year. Today's physical exam did not reveal any abnormalities.  Survivorship:encouraged her to stay active. She works out in [employer].  Return to clinic in one year for  followup   No orders of the defined types were placed in this encounter.   The patient has a good understanding of the overall plan. she agrees with it. she will call with any problems that may develop before the next visit here.   Rulon Eisenmenger, MD 04/17/2015

## 2015-11-08 DIAGNOSIS — H35712 Central serous chorioretinopathy, left eye: Secondary | ICD-10-CM | POA: Diagnosis not present

## 2015-11-08 DIAGNOSIS — H35413 Lattice degeneration of retina, bilateral: Secondary | ICD-10-CM | POA: Diagnosis not present

## 2015-11-08 DIAGNOSIS — H5213 Myopia, bilateral: Secondary | ICD-10-CM | POA: Diagnosis not present

## 2015-12-30 DIAGNOSIS — IMO0002 Reserved for concepts with insufficient information to code with codable children: Secondary | ICD-10-CM

## 2015-12-30 HISTORY — DX: Reserved for concepts with insufficient information to code with codable children: IMO0002

## 2016-01-29 ENCOUNTER — Ambulatory Visit (INDEPENDENT_AMBULATORY_CARE_PROVIDER_SITE_OTHER): Payer: BLUE CROSS/BLUE SHIELD | Admitting: Gynecology

## 2016-01-29 ENCOUNTER — Encounter: Payer: Self-pay | Admitting: Gynecology

## 2016-01-29 ENCOUNTER — Other Ambulatory Visit: Payer: Self-pay | Admitting: Gynecology

## 2016-01-29 VITALS — BP 118/76 | Ht 60.0 in | Wt 133.0 lb

## 2016-01-29 DIAGNOSIS — Z01419 Encounter for gynecological examination (general) (routine) without abnormal findings: Secondary | ICD-10-CM

## 2016-01-29 DIAGNOSIS — Z13220 Encounter for screening for lipoid disorders: Secondary | ICD-10-CM

## 2016-01-29 DIAGNOSIS — Z1329 Encounter for screening for other suspected endocrine disorder: Secondary | ICD-10-CM

## 2016-01-29 DIAGNOSIS — N390 Urinary tract infection, site not specified: Secondary | ICD-10-CM

## 2016-01-29 DIAGNOSIS — E559 Vitamin D deficiency, unspecified: Secondary | ICD-10-CM

## 2016-01-29 LAB — URINALYSIS W MICROSCOPIC + REFLEX CULTURE
Bacteria, UA: NONE SEEN [HPF]
Bilirubin Urine: NEGATIVE
CASTS: NONE SEEN [LPF]
Crystals: NONE SEEN [HPF]
Glucose, UA: NEGATIVE
Hgb urine dipstick: NEGATIVE
KETONES UR: NEGATIVE
LEUKOCYTES UA: NEGATIVE
NITRITE: NEGATIVE
PH: 5.5 (ref 5.0–8.0)
Protein, ur: NEGATIVE
RBC / HPF: NONE SEEN RBC/HPF (ref <=2)
SPECIFIC GRAVITY, URINE: 1.009 (ref 1.001–1.035)
Squamous Epithelial / LPF: NONE SEEN [HPF] (ref <=5)
WBC, UA: NONE SEEN WBC/HPF (ref <=5)
YEAST: NONE SEEN [HPF]

## 2016-01-29 LAB — CBC WITH DIFFERENTIAL/PLATELET
BASOS PCT: 1 %
Basophils Absolute: 63 cells/uL (ref 0–200)
EOS PCT: 2 %
Eosinophils Absolute: 126 cells/uL (ref 15–500)
HCT: 37.1 % (ref 35.0–45.0)
Hemoglobin: 12.3 g/dL (ref 11.7–15.5)
LYMPHS PCT: 24 %
Lymphs Abs: 1512 cells/uL (ref 850–3900)
MCH: 29.4 pg (ref 27.0–33.0)
MCHC: 33.2 g/dL (ref 32.0–36.0)
MCV: 88.5 fL (ref 80.0–100.0)
MONOS PCT: 6 %
MPV: 8.8 fL (ref 7.5–12.5)
Monocytes Absolute: 378 cells/uL (ref 200–950)
NEUTROS ABS: 4221 {cells}/uL (ref 1500–7800)
Neutrophils Relative %: 67 %
PLATELETS: 261 10*3/uL (ref 140–400)
RBC: 4.19 MIL/uL (ref 3.80–5.10)
RDW: 13.7 % (ref 11.0–15.0)
WBC: 6.3 10*3/uL (ref 3.8–10.8)

## 2016-01-29 LAB — COMPREHENSIVE METABOLIC PANEL
ALK PHOS: 49 U/L (ref 33–130)
ALT: 15 U/L (ref 6–29)
AST: 18 U/L (ref 10–35)
Albumin: 4 g/dL (ref 3.6–5.1)
BUN: 10 mg/dL (ref 7–25)
CO2: 22 mmol/L (ref 20–31)
CREATININE: 0.86 mg/dL (ref 0.50–1.05)
Calcium: 9.2 mg/dL (ref 8.6–10.4)
Chloride: 106 mmol/L (ref 98–110)
GLUCOSE: 108 mg/dL — AB (ref 65–99)
POTASSIUM: 4.1 mmol/L (ref 3.5–5.3)
SODIUM: 139 mmol/L (ref 135–146)
Total Bilirubin: 0.3 mg/dL (ref 0.2–1.2)
Total Protein: 6.3 g/dL (ref 6.1–8.1)

## 2016-01-29 LAB — TSH: TSH: 2.19 m[IU]/L

## 2016-01-29 LAB — LIPID PANEL
CHOL/HDL RATIO: 2.2 ratio (ref <=5.0)
Cholesterol: 176 mg/dL (ref 125–200)
HDL: 80 mg/dL (ref >=46)
LDL Cholesterol: 81 mg/dL (ref <130)
Triglycerides: 73 mg/dL (ref <150)
VLDL: 15 mg/dL (ref <30)

## 2016-01-29 MED ORDER — NITROFURANTOIN MONOHYD MACRO 100 MG PO CAPS: 100.0000 mg | ORAL_CAPSULE | Freq: Once | ORAL | 5 refills | Status: AC

## 2016-01-29 NOTE — Addendum Note (Signed)
Addended by: Nelva Nay on: 01/29/2016 08:44 AM   Modules accepted: Orders

## 2016-01-29 NOTE — Progress Notes (Signed)
    Evelyn Castro 1965/09/25 [id number]        50 y.o.  G0P0  for annual exam.  Doing well.  Past medical history,surgical history, problem list, medications, allergies, family history and social history were all reviewed and documented as reviewed in the EPIC chart.  ROS:  Performed with pertinent positives and negatives included in the history, assessment and plan.   Additional significant findings :  Frequent UTIs   Exam: [name] assistant Vitals:   01/29/16 0758  BP: 118/76  Weight: 133 lb (60.3 kg)  Height: 5' (1.524 m)   General appearance:  Normal affect, orientation and appearance. Skin: Grossly normal HEENT: Without gross lesions.  No cervical or supraclavicular adenopathy. Thyroid normal.  Lungs:  Clear without wheezing, rales or rhonchi Cardiac: RR, without RMG Abdominal:  Soft, nontender, without masses, guarding, rebound, organomegaly or hernia Breasts:  Examined lying and sitting without masses, retractions, discharge or axillary adenopathy.  Well-healed right lumpectomy scar Pelvic:  Ext/BUS/Vagina Normal  Adnexa without masses or tenderness    Anus and perineum normal   Rectovaginal normal sphincter tone without palpated masses or tenderness.    Assessment/Plan:  50 y.o. G0P0 female for annual exam.   1. Status post TAH for leiomyoma and endometriosis 2012. Doing well without significant hot flushes, night sweats, vaginal dryness. Continue to monitor and report any issues. 2. Frequent postcoital UTIs. Check urinalysis today. Macrobid 100 mg after intercourse trial. Follow up if continues to be an issue. #30 with 5 refills provided. 3. Pap smear 2016. 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 Will continue Pap smears annually. 4. History lobular carcinoma in situ right breast. Status post lumpectomy. Exam NED. Mammography due now and patient will schedule. SBE monthly reviewed. 5. Colonoscopy never. Recommended screening colonoscopy as she has turned 49 and she agrees to  arrange. 6. Health maintenance. Baseline CBC, CMP, lipid profile, urinalysis, vitamin D, TSH ordered. Follow up 1 year, sooner as needed.   [name] MD, 8:22 AM 01/29/2016

## 2016-01-30 ENCOUNTER — Other Ambulatory Visit: Payer: Self-pay | Admitting: *Deleted

## 2016-01-30 DIAGNOSIS — R7309 Other abnormal glucose: Secondary | ICD-10-CM

## 2016-01-30 LAB — PAP IG W/ RFLX HPV ASCU

## 2016-01-30 LAB — VITAMIN D 25 HYDROXY (VIT D DEFICIENCY, FRACTURES): VIT D 25 HYDROXY: 46 ng/mL (ref 30–100)

## 2016-01-31 ENCOUNTER — Encounter: Payer: Self-pay | Admitting: Gynecology

## 2016-02-06 ENCOUNTER — Encounter: Payer: Self-pay | Admitting: Gynecology

## 2016-02-06 DIAGNOSIS — Z853 Personal history of malignant neoplasm of breast: Secondary | ICD-10-CM | POA: Diagnosis not present

## 2016-02-15 ENCOUNTER — Encounter: Payer: Self-pay | Admitting: Gynecology

## 2016-02-15 ENCOUNTER — Ambulatory Visit (INDEPENDENT_AMBULATORY_CARE_PROVIDER_SITE_OTHER): Payer: BLUE CROSS/BLUE SHIELD | Admitting: Gynecology

## 2016-02-15 VITALS — BP 120/78

## 2016-02-15 DIAGNOSIS — IMO0002 Reserved for concepts with insufficient information to code with codable children: Secondary | ICD-10-CM

## 2016-02-15 DIAGNOSIS — R87629 Unspecified abnormal cytological findings in specimens from vagina: Secondary | ICD-10-CM | POA: Diagnosis not present

## 2016-02-15 DIAGNOSIS — R896 Abnormal cytological findings in specimens from other organs, systems and tissues: Secondary | ICD-10-CM | POA: Diagnosis not present

## 2016-02-15 NOTE — Progress Notes (Signed)
    Evelyn Castro 04/09/1966 [id number]        50 y.o.  G0P0 presents for colposcopy. Most recent Pap smear showed AGUS. She is status post TAH for endometriosis. Also has a history of DES exposure. Review of the Pap smear by the pathologist not knowing about the hysterectomy initially stated "They do not look blatantly malignant but mildly atypical looking". Before she knew about the hysterectomy she was thinking ? Endometriosis.  Past medical history,surgical history, problem list, medications, allergies, family history and social history were all reviewed and documented in the EPIC chart.  Directed ROS with pertinent positives and negatives documented in the history of present illness/assessment and plan.  Exam: [name] assistant Vitals:   02/15/16 1420  BP: 120/78   General appearance:  Normal Pelvic external BUS vagina grossly normal both visually and palpably. Bimanual exam without masses or tenderness  Colposcopy performed after acetic acid cleanse the entire vagina showed no gross abnormalities. She had a small polypoid area at the upper left cuff which I think is redundant mucosa from closing the vagina. I went ahead and biopsied this area for completeness.  Assessment/Plan:  50 y.o. G0P0 with atypical glandular cells in a patient status post hysterectomy for endometriosis with history of DES exposure. We'll follow up on biopsy results. We will have different pathologist reviewed the slide. Will consider GYN oncology review pending the Pap smear follow up review.    [name] MD, 2:40 PM 02/15/2016

## 2016-02-15 NOTE — Patient Instructions (Signed)
Office will call you with biopsy results 

## 2016-02-15 NOTE — Addendum Note (Signed)
Addended by: [name] on: 02/15/2016 03:15 PM   Modules accepted: Orders

## 2016-02-26 LAB — CYTOLOGY - PAP

## 2016-02-28 ENCOUNTER — Telehealth: Payer: Self-pay | Admitting: Gynecology

## 2016-02-28 ENCOUNTER — Encounter: Payer: Self-pay | Admitting: Gynecology

## 2016-02-28 NOTE — Telephone Encounter (Signed)
Followed up by phone with Dr. Lyndon Code who reviewed her Pap smear that was read as AGUS elsewhere. He also showed this to other pathologists who all agreed that there are benign glandular cells without any atypia. The patient does have a history of endometriosis. I subsequently called the patient and discussed this with her. Whether these are transformational or related to subclinical endometriosis they are totally benign in appearance. With a negative colposcopy I feel comfortable following this for now and recommended a repeat Pap smear in one year. The biopsy that I did at colposcopy was read as suggestive of koilocytotic atypia but showed no glandular issues. Patient's comfortable with following and will repeat Pap smear in one year

## 2016-03-19 DIAGNOSIS — H35412 Lattice degeneration of retina, left eye: Secondary | ICD-10-CM | POA: Diagnosis not present

## 2016-03-19 DIAGNOSIS — H35411 Lattice degeneration of retina, right eye: Secondary | ICD-10-CM | POA: Diagnosis not present

## 2016-04-04 DIAGNOSIS — Z23 Encounter for immunization: Secondary | ICD-10-CM | POA: Diagnosis not present

## 2016-04-18 ENCOUNTER — Encounter: Payer: Self-pay | Admitting: Hematology and Oncology

## 2016-04-18 ENCOUNTER — Ambulatory Visit (HOSPITAL_BASED_OUTPATIENT_CLINIC_OR_DEPARTMENT_OTHER): Payer: BLUE CROSS/BLUE SHIELD | Admitting: Hematology and Oncology

## 2016-04-18 VITALS — BP 140/74 | HR 99 | Temp 99.0°F | Resp 18 | Wt 137.8 lb

## 2016-04-18 DIAGNOSIS — Z79810 Long term (current) use of selective estrogen receptor modulators (SERMs): Secondary | ICD-10-CM | POA: Diagnosis not present

## 2016-04-18 DIAGNOSIS — D0501 Lobular carcinoma in situ of right breast: Secondary | ICD-10-CM | POA: Diagnosis not present

## 2016-04-18 DIAGNOSIS — N390 Urinary tract infection, site not specified: Secondary | ICD-10-CM

## 2016-04-18 DIAGNOSIS — D0500 Lobular carcinoma in situ of unspecified breast: Secondary | ICD-10-CM

## 2016-04-18 MED ORDER — NITROFURANTOIN MONOHYD MACRO 100 MG PO CAPS: 100.0000 mg | ORAL_CAPSULE | Freq: Two times a day (BID) | ORAL | Status: DC

## 2016-04-18 MED ORDER — TAMOXIFEN CITRATE 20 MG PO TABS: 20.0000 mg | ORAL_TABLET | Freq: Every day | ORAL | 6 refills | Status: DC

## 2016-04-18 NOTE — Assessment & Plan Note (Signed)
LCIS right breast: Status post lumpectomy now on breast cancer prevention with tamoxifen 20 mg daily started September 2014. Year Tamoxifen toxicities: Patient is tolerating tamoxifen extremely well without any major problems or concerns. She denies any hot flashes or muscle aches or pains other than the usual symptoms. She had hysterectomy previously.  Breast Cancer Surveillance: 1. Breast exam 04/18/2016: Normal 2. Mammogram 02/06/2016 No abnormalities. Postsurgical changes. Breast Density Category C. I recommended that she get 3-D mammograms for surveillance. Discussed the differences between different breast density categories.     Survivorship:encouraged her to stay active. She works out in [employer].  Return to clinic in one year for followup

## 2016-04-18 NOTE — Progress Notes (Signed)
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 She also had left breast cysts on ultrasound. Patient went on to have a core biopsy of the right calcifications and the findings were consistent with lobular carcinoma in situ. She was seen by Dr. Rolm Bookbinder who recommended a wire guided excisional biopsy. She had the procedure performed on 11/11/2012. The pathology revealed lobular carcinoma in situ  #2 patient was seen in the high-risk clinic. She was started on tamoxifen 20 mg daily. Total of 5 years of therapy is planned.   CURRENT THERAPY:Tamoxifen 20 mg daily  CHIEF COMPLIANT: Followup of LCIS  INTERVAL HISTORY: Evelyn Castro is a 50 year old with above-mentioned history of LCIS currently on risk reduction therapy with tamoxifen. She is tolerating it extremely well. She denies any hot flashes other than when she has a glass of wine. Denies any myalgias. Denies any vaginal bleeding. She was started on nitrofurantoin for recurrent UTIs after intercourse  REVIEW OF SYSTEMS:   Constitutional: Denies fevers, chills or abnormal weight loss Eyes: Denies blurriness of vision Ears, nose, mouth, throat, and face: Denies mucositis or sore throat Respiratory: Denies cough, dyspnea or wheezes Cardiovascular: Denies palpitation, chest discomfort Gastrointestinal:  Denies nausea, heartburn or change in bowel habits Skin: Denies abnormal skin rashes Lymphatics: Denies new lymphadenopathy or easy bruising Neurological:Denies numbness, tingling or new weaknesses Behavioral/Psych: Mood is stable, no new changes  Extremities: No lower extremity edema Breast:  denies any pain or lumps or nodules in either breasts All other systems were reviewed  with the patient and are negative.  I have reviewed the past medical history, past surgical history, social history and family history with the patient and they are unchanged from previous note.  ALLERGIES:  has No Known Allergies.  MEDICATIONS:  Current Outpatient Prescriptions  Medication Sig Dispense Refill  . Cholecalciferol (VITAMIN D PO) Take by mouth.    . DiphenhydrAMINE HCl (BENADRYL PO) Take 25 mg by mouth daily as needed. For allergy symptoms     . famciclovir (FAMVIR) 500 MG tablet TAKE 4 TABS WHEN YOU NOTICE THE FIRST SIGN OF A COLD SORE.  1  . ibuprofen (ADVIL,MOTRIN) 200 MG tablet Take 200 mg by mouth 4 (four) times daily as needed. For menstrual cramps    . Multiple Vitamin (MULTIVITAMIN) capsule Take 1 capsule by mouth daily.     . Pseudoephedrine HCl (SUDAFED 12 HOUR PO) Take by mouth.    . tamoxifen (NOLVADEX) 20 MG tablet Take 1 tablet (20 mg total) by mouth daily. 90 tablet 6   No current facility-administered medications for this visit.     PHYSICAL EXAMINATION: ECOG PERFORMANCE STATUS: 0 - Asymptomatic  Vitals:   04/18/16 0900  BP: 140/74  Pulse: 99  Resp: 18  Temp: 99 F (37.2 C)   Filed Weights   04/18/16 0900  Weight: 137 lb 12.8 oz (62.5 kg)    GENERAL:alert, no distress and comfortable SKIN: skin color, texture, turgor are normal, no rashes or significant lesions EYES: normal, Conjunctiva are pink and non-injected, sclera clear OROPHARYNX:no exudate, no erythema and lips, buccal mucosa, and tongue normal  NECK: supple, thyroid normal size, non-tender, without nodularity LYMPH:  no palpable lymphadenopathy in the cervical, axillary or inguinal LUNGS: clear to auscultation and percussion with  normal breathing effort HEART: regular rate & rhythm and no murmurs and no lower extremity edema ABDOMEN:abdomen soft, non-tender and normal bowel sounds MUSCULOSKELETAL:no cyanosis of digits and no clubbing  NEURO: alert & oriented x 3 with fluent speech,  no focal motor/sensory deficits EXTREMITIES: No lower extremity edema BREAST: No palpable masses or nodules in either right or left breasts. No palpable axillary supraclavicular or infraclavicular adenopathy no breast tenderness or nipple discharge. (exam performed in the presence of a chaperone)  LABORATORY DATA:  I have reviewed the data as listed   Chemistry      Component Value Date/Time   NA 139 01/29/2016 0822   NA 141 03/18/2013 1027   K 4.1 01/29/2016 0822   K 4.1 03/18/2013 1027   CL 106 01/29/2016 0822   CO2 22 01/29/2016 0822   CO2 24 03/18/2013 1027   BUN 10 01/29/2016 0822   BUN 10.7 03/18/2013 1027   CREATININE 0.86 01/29/2016 0822   CREATININE 0.9 03/18/2013 1027      Component Value Date/Time   CALCIUM 9.2 01/29/2016 0822   CALCIUM 9.1 03/18/2013 1027   ALKPHOS 49 01/29/2016 0822   ALKPHOS 51 03/18/2013 1027   AST 18 01/29/2016 0822   AST 20 03/18/2013 1027   ALT 15 01/29/2016 0822   ALT 21 03/18/2013 1027   BILITOT 0.3 01/29/2016 0822   BILITOT 0.34 03/18/2013 1027       Lab Results  Component Value Date   WBC 6.3 01/29/2016   HGB 12.3 01/29/2016   HCT 37.1 01/29/2016   MCV 88.5 01/29/2016   PLT 261 01/29/2016   NEUTROABS 4,221 01/29/2016   ASSESSMENT & PLAN:  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  Tamoxifen toxicities: Patient is tolerating tamoxifen extremely well without any major problems or concerns. She denies any hot flashes or muscle aches or pains other than the usual symptoms. She had hysterectomy previously.  Breast Cancer Surveillance: 1. Breast exam 04/18/2016: Normal 2. Mammogram 02/06/2016 No abnormalities. Postsurgical changes. Breast Density Category C. I recommended that she get 3-D mammograms for surveillance. Discussed the differences between different breast density categories.    Survivorship:encouraged her to stay active. She works out in [employer]. Return to clinic in one year for followup  No orders of the defined types were placed in this encounter.  The patient has a good understanding of the overall plan. she agrees with it. she will call with any problems that may develop before the next visit here.   Rulon Eisenmenger, MD 04/18/16

## 2016-11-08 DIAGNOSIS — H35413 Lattice degeneration of retina, bilateral: Secondary | ICD-10-CM | POA: Diagnosis not present

## 2016-11-08 DIAGNOSIS — H5213 Myopia, bilateral: Secondary | ICD-10-CM | POA: Diagnosis not present

## 2017-02-18 DIAGNOSIS — Z853 Personal history of malignant neoplasm of breast: Secondary | ICD-10-CM | POA: Diagnosis not present

## 2017-02-18 DIAGNOSIS — R922 Inconclusive mammogram: Secondary | ICD-10-CM | POA: Diagnosis not present

## 2017-03-11 ENCOUNTER — Encounter: Payer: Self-pay | Admitting: Gynecology

## 2017-03-11 ENCOUNTER — Ambulatory Visit (INDEPENDENT_AMBULATORY_CARE_PROVIDER_SITE_OTHER): Payer: BLUE CROSS/BLUE SHIELD | Admitting: Gynecology

## 2017-03-11 VITALS — BP 134/80 | Ht 60.0 in | Wt 142.0 lb

## 2017-03-11 DIAGNOSIS — Z9189 Other specified personal risk factors, not elsewhere classified: Secondary | ICD-10-CM

## 2017-03-11 DIAGNOSIS — N951 Menopausal and female climacteric states: Secondary | ICD-10-CM

## 2017-03-11 DIAGNOSIS — Z23 Encounter for immunization: Secondary | ICD-10-CM | POA: Diagnosis not present

## 2017-03-11 DIAGNOSIS — N390 Urinary tract infection, site not specified: Secondary | ICD-10-CM

## 2017-03-11 DIAGNOSIS — Z01411 Encounter for gynecological examination (general) (routine) with abnormal findings: Secondary | ICD-10-CM

## 2017-03-11 DIAGNOSIS — Z13220 Encounter for screening for lipoid disorders: Secondary | ICD-10-CM

## 2017-03-11 DIAGNOSIS — N952 Postmenopausal atrophic vaginitis: Secondary | ICD-10-CM | POA: Diagnosis not present

## 2017-03-11 LAB — COMPREHENSIVE METABOLIC PANEL
AG Ratio: 1.4 (calc) (ref 1.0–2.5)
ALT: 22 U/L (ref 6–29)
AST: 19 U/L (ref 10–35)
Albumin: 4 g/dL (ref 3.6–5.1)
Alkaline phosphatase (APISO): 63 U/L (ref 33–130)
BUN: 11 mg/dL (ref 7–25)
CHLORIDE: 105 mmol/L (ref 98–110)
CO2: 28 mmol/L (ref 20–32)
CREATININE: 0.84 mg/dL (ref 0.50–1.05)
Calcium: 8.9 mg/dL (ref 8.6–10.4)
GLUCOSE: 102 mg/dL — AB (ref 65–99)
Globulin: 2.8 g/dL (calc) (ref 1.9–3.7)
Potassium: 4.2 mmol/L (ref 3.5–5.3)
SODIUM: 140 mmol/L (ref 135–146)
TOTAL PROTEIN: 6.8 g/dL (ref 6.1–8.1)
Total Bilirubin: 0.3 mg/dL (ref 0.2–1.2)

## 2017-03-11 LAB — CBC WITH DIFFERENTIAL/PLATELET
Basophils Absolute: 50 cells/uL (ref 0–200)
Basophils Relative: 1.1 %
EOS ABS: 117 {cells}/uL (ref 15–500)
EOS PCT: 2.6 %
HCT: 37.5 % (ref 35.0–45.0)
Hemoglobin: 12.7 g/dL (ref 11.7–15.5)
Lymphs Abs: 1143 cells/uL (ref 850–3900)
MCH: 29.1 pg (ref 27.0–33.0)
MCHC: 33.9 g/dL (ref 32.0–36.0)
MCV: 86 fL (ref 80.0–100.0)
MONOS PCT: 7.7 %
MPV: 9.5 fL (ref 7.5–12.5)
Neutro Abs: 2844 cells/uL (ref 1500–7800)
Neutrophils Relative %: 63.2 %
PLATELETS: 233 10*3/uL (ref 140–400)
RBC: 4.36 10*6/uL (ref 3.80–5.10)
RDW: 12.9 % (ref 11.0–15.0)
TOTAL LYMPHOCYTE: 25.4 %
WBC mixed population: 347 cells/uL (ref 200–950)
WBC: 4.5 10*3/uL (ref 3.8–10.8)

## 2017-03-11 LAB — LIPID PANEL
CHOL/HDL RATIO: 3.1 (calc) (ref <5.0)
Cholesterol: 187 mg/dL (ref <200)
HDL: 61 mg/dL (ref >50)
LDL Cholesterol (Calc): 109 mg/dL (calc) — ABNORMAL HIGH
NON-HDL CHOLESTEROL (CALC): 126 mg/dL (ref <130)
TRIGLYCERIDES: 83 mg/dL (ref <150)

## 2017-03-11 LAB — FOLLICLE STIMULATING HORMONE: FSH: 24.7 m[IU]/mL

## 2017-03-11 LAB — TSH: TSH: 1.47 mIU/L

## 2017-03-11 MED ORDER — NITROFURANTOIN MACROCRYSTAL 100 MG PO CAPS: 100.0000 mg | ORAL_CAPSULE | ORAL | 3 refills | Status: DC | PRN

## 2017-03-11 NOTE — Addendum Note (Signed)
Addended by: Burnett Kanaris on: 03/11/2017 08:53 AM   Modules accepted: Orders

## 2017-03-11 NOTE — Progress Notes (Signed)
    Evelyn Castro 24-Oct-1965 [id number]        51 y.o.  G0P0 for annual gynecologic exam.    Past medical history,surgical history, problem list, medications, allergies, family history and social history were all reviewed and documented as reviewed in the EPIC chart.  ROS:  Performed with pertinent positives and negatives included in the history, assessment and plan.   Additional significant findings :  None   Exam: [name] assistant Vitals:   03/11/17 0803  BP: 134/80  Weight: 142 lb (64.4 kg)  Height: 5' (1.524 m)   Body mass index is 27.73 kg/m.  General appearance:  Normal affect, orientation and appearance. Skin: Grossly normal HEENT: Without gross lesions.  No cervical or supraclavicular adenopathy. Thyroid normal.  Lungs:  Clear without wheezing, rales or rhonchi Cardiac: RR, without RMG Abdominal:  Soft, nontender, without masses, guarding, rebound, organomegaly or hernia Breasts:  Examined lying and sitting without masses, retractions, discharge or axillary adenopathy.  Well-healed right lumpectomy scar Pelvic:  Ext, BUS, Vagina: Normal with mild atrophic changes. Pap smear done  Adnexa: Without masses or tenderness    Anus and perineum: Normal   Rectovaginal: Normal sphincter tone without palpated masses or tenderness.    Assessment/Plan:  51 y.o. G0P0 female for annual gynecologic exam.   1. Status post TVH for leiomyoma and endometriosis 2012. Does have occasional hot flushes and sweats. We'll check baseline TSH and FSH. Does not feel her symptoms warrant treatment at this time. 2. 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 Pap smear last year showed glandular cells. Colposcopy with biopsy showed slight koilocytotic atypia. Review of the Pap smear by pathologists felt it was benign endometrial type cells consistent with her history of endometriosis. Pap smear done today. We'll continue with annual cytology. 3. Post coital UTIs. Doing well with Macrobid 100 mg after  intercourse. #30 with 3 refills provided. 4. History of carcinoma in situ right breast status post lumpectomy. Exam NED. Mammography 01/2017. Continue with annual mammography. 5. Colonoscopy never. Recommended screening colonoscopy as she is 51. Patient agrees to arrange. Names and numbers provided. 6. DEXA never. Will plan further into the menopause. 7. Health maintenance. Requests baseline labs. CBC, CMP, lipid profile, TSH and FSH ordered. Vitamin D last year normal when she does continue with her vitamin D supplementation. Follow up in one year, sooner as needed.   [name] MD, 8:30 AM 03/11/2017

## 2017-03-11 NOTE — Patient Instructions (Signed)
Schedule your colonoscopy with either:  [facility]   Address: [address], [place], [place] [address]  [phone]    or  [facility]  Address: [address], [place], [place] [address]  [phone]      

## 2017-03-13 LAB — PAP IG W/ RFLX HPV ASCU

## 2017-03-19 DIAGNOSIS — H43813 Vitreous degeneration, bilateral: Secondary | ICD-10-CM | POA: Diagnosis not present

## 2017-03-19 DIAGNOSIS — H33302 Unspecified retinal break, left eye: Secondary | ICD-10-CM | POA: Diagnosis not present

## 2017-03-19 DIAGNOSIS — H35413 Lattice degeneration of retina, bilateral: Secondary | ICD-10-CM | POA: Diagnosis not present

## 2017-04-15 NOTE — Assessment & Plan Note (Addendum)
LCIS right breast: Status post lumpectomy now on breast cancer prevention with tamoxifen 20 mg daily started September 2014. Year Tamoxifen toxicities: Patient is tolerating tamoxifen extremely well without any major problems or concerns. She denies any hot flashes or muscle aches or pains other than the usual symptoms. She had hysterectomy previously.  Breast Cancer Surveillance: 1. Breast exam 04/15/2017: Normal 2. Mammogram 02/18/2017 No abnormalities. Postsurgical changes. Breast Density Category C. I recommended that she get 3-D mammograms for surveillance. Discussed the differences between different breast density categories.  Survivorship:encouraged her to stay active. She works out in [employer].  Return to clinic in one year for followup

## 2017-04-18 ENCOUNTER — Encounter: Payer: Self-pay | Admitting: Hematology and Oncology

## 2017-04-18 ENCOUNTER — Ambulatory Visit (HOSPITAL_BASED_OUTPATIENT_CLINIC_OR_DEPARTMENT_OTHER): Payer: BLUE CROSS/BLUE SHIELD | Admitting: Hematology and Oncology

## 2017-04-18 ENCOUNTER — Telehealth: Payer: Self-pay | Admitting: Hematology and Oncology

## 2017-04-18 DIAGNOSIS — Z79810 Long term (current) use of selective estrogen receptor modulators (SERMs): Secondary | ICD-10-CM

## 2017-04-18 DIAGNOSIS — D0501 Lobular carcinoma in situ of right breast: Secondary | ICD-10-CM | POA: Diagnosis not present

## 2017-04-18 MED ORDER — TAMOXIFEN CITRATE 20 MG PO TABS: 20.0000 mg | ORAL_TABLET | Freq: Every day | ORAL | 3 refills | Status: DC

## 2017-04-18 NOTE — Progress Notes (Signed)
Patient Care Team: Fontaine, Belinda Block, MD as PCP - General (Obstetrics and Gynecology)  DIAGNOSIS:  Encounter Diagnosis  Name Primary?  . 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 She is tolerating it fairly well. She does have occasional hot flashes. Denies any myalgias or arthralgias. Denies any lumps or nodules in the breast.  REVIEW OF SYSTEMS:   Constitutional: Denies fevers, chills or abnormal weight loss Eyes: Denies blurriness of vision Ears, nose, mouth, throat, and face: Denies mucositis or sore throat Respiratory: Denies cough, dyspnea or wheezes Cardiovascular: Denies palpitation, chest discomfort Gastrointestinal:  Denies nausea, heartburn or change in bowel habits Skin: Denies abnormal skin rashes Lymphatics: Denies new lymphadenopathy or easy bruising Neurological:Denies numbness, tingling or new weaknesses Behavioral/Psych: Mood is stable, no new changes  Extremities: No lower extremity edema Breast:  denies any pain or lumps or nodules in either breasts All other systems were reviewed with the patient and are negative.  I have reviewed the past medical history, past surgical history, social history and family history with the patient and they are unchanged from previous note.  ALLERGIES:  has No Known Allergies.  MEDICATIONS:  Current Outpatient Prescriptions  Medication Sig Dispense Refill  . Cholecalciferol (VITAMIN D PO) Take by mouth.    . DiphenhydrAMINE HCl (BENADRYL PO) Take 25 mg by mouth daily as needed. For allergy symptoms     . ibuprofen (ADVIL,MOTRIN) 200 MG tablet Take 200 mg by mouth 4 (four) times daily as needed. For menstrual cramps    . Multiple Vitamin (MULTIVITAMIN) capsule Take 1 capsule by mouth daily.     . nitrofurantoin (MACRODANTIN) 100 MG  capsule Take 1 capsule (100 mg total) by mouth as needed. After intercourse 30 capsule 3  . Pseudoephedrine HCl (SUDAFED 12 HOUR PO) Take by mouth.    . tamoxifen (NOLVADEX) 20 MG tablet Take 1 tablet (20 mg total) by mouth daily. 90 tablet 6   No current facility-administered medications for this visit.     PHYSICAL EXAMINATION: ECOG PERFORMANCE STATUS: 1 - Symptomatic but completely ambulatory  Vitals:   04/18/17 0849  BP: (!) 144/78  Pulse: 92  Resp: 20  Temp: 98.5 F (36.9 C)  SpO2: 100%   Filed Weights   04/18/17 0849  Weight: 142 lb 9.6 oz (64.7 kg)    GENERAL:alert, no distress and comfortable SKIN: skin color, texture, turgor are normal, no rashes or significant lesions EYES: normal, Conjunctiva are pink and non-injected, sclera clear OROPHARYNX:no exudate, no erythema and lips, buccal mucosa, and tongue normal  NECK: supple, thyroid normal size, non-tender, without nodularity LYMPH:  no palpable lymphadenopathy in the cervical, axillary or inguinal LUNGS: clear to auscultation and percussion with normal breathing effort HEART: regular rate & rhythm and no murmurs and no lower extremity edema ABDOMEN:abdomen soft, non-tender and normal bowel sounds MUSCULOSKELETAL:no cyanosis of digits and no clubbing  NEURO: alert & oriented x 3 with fluent speech, no focal motor/sensory deficits EXTREMITIES: No lower extremity edema BREAST: No palpable masses or nodules in either right or left breasts. No palpable axillary supraclavicular or infraclavicular adenopathy no breast tenderness or nipple discharge. (exam performed in the presence of a chaperone)  LABORATORY DATA:  I have reviewed the data as listed   Chemistry      Component Value Date/Time   NA  140 03/11/2017 0833   NA 141 03/18/2013 1027   K 4.2 03/11/2017 0833   K 4.1 03/18/2013 1027   CL 105 03/11/2017 0833   CO2 28 03/11/2017 0833   CO2 24 03/18/2013 1027   BUN 11 03/11/2017 0833   BUN 10.7 03/18/2013 1027     CREATININE 0.84 03/11/2017 0833   CREATININE 0.9 03/18/2013 1027      Component Value Date/Time   CALCIUM 8.9 03/11/2017 0833   CALCIUM 9.1 03/18/2013 1027   ALKPHOS 49 01/29/2016 0822   ALKPHOS 51 03/18/2013 1027   AST 19 03/11/2017 0833   AST 20 03/18/2013 1027   ALT 22 03/11/2017 0833   ALT 21 03/18/2013 1027   BILITOT 0.3 03/11/2017 0833   BILITOT 0.34 03/18/2013 1027       Lab Results  Component Value Date   WBC 4.5 03/11/2017   HGB 12.7 03/11/2017   HCT 37.5 03/11/2017   MCV 86.0 03/11/2017   PLT 233 03/11/2017   NEUTROABS 2,844 03/11/2017    ASSESSMENT & PLAN:  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  Tamoxifen toxicities: Patient is tolerating tamoxifen extremely well without any major problems or concerns. She denies any hot flashes or muscle aches or pains other than the usual symptoms. She had hysterectomy previously.  Breast Cancer Surveillance: 1. Breast exam 04/15/2017: Normal 2. Mammogram 02/18/2017 No abnormalities. Postsurgical changes. Breast Density Category C. I recommended that she get 3-D mammograms for surveillance. Discussed the differences between different breast density categories.  Survivorship:encouraged her to stay active. She works out in [employer].  Return to clinic in one year for followup   I spent 25 minutes talking to the patient of which more than half was spent in counseling and coordination of care.  No orders of the defined types were placed in this encounter.  The patient has a good understanding of the overall plan. she agrees with it. she will call with any problems that may develop before the next visit here.   Rulon Eisenmenger, MD 04/18/17

## 2017-04-18 NOTE — Telephone Encounter (Signed)
Scheduled patient per 10/19 los for October 2019.

## 2017-11-10 DIAGNOSIS — H35413 Lattice degeneration of retina, bilateral: Secondary | ICD-10-CM | POA: Diagnosis not present

## 2017-11-10 DIAGNOSIS — H5213 Myopia, bilateral: Secondary | ICD-10-CM | POA: Diagnosis not present

## 2018-02-23 ENCOUNTER — Encounter: Payer: Self-pay | Admitting: Gynecology

## 2018-02-23 DIAGNOSIS — Z853 Personal history of malignant neoplasm of breast: Secondary | ICD-10-CM | POA: Diagnosis not present

## 2018-02-23 DIAGNOSIS — R922 Inconclusive mammogram: Secondary | ICD-10-CM | POA: Diagnosis not present

## 2018-03-19 DIAGNOSIS — Z1211 Encounter for screening for malignant neoplasm of colon: Secondary | ICD-10-CM | POA: Diagnosis not present

## 2018-03-19 DIAGNOSIS — K635 Polyp of colon: Secondary | ICD-10-CM | POA: Diagnosis not present

## 2018-03-24 DIAGNOSIS — Z1211 Encounter for screening for malignant neoplasm of colon: Secondary | ICD-10-CM | POA: Diagnosis not present

## 2018-03-24 DIAGNOSIS — K635 Polyp of colon: Secondary | ICD-10-CM | POA: Diagnosis not present

## 2018-03-26 ENCOUNTER — Encounter: Payer: Self-pay | Admitting: Gynecology

## 2018-03-26 ENCOUNTER — Ambulatory Visit (INDEPENDENT_AMBULATORY_CARE_PROVIDER_SITE_OTHER): Payer: BLUE CROSS/BLUE SHIELD | Admitting: Gynecology

## 2018-03-26 VITALS — BP 118/76 | Ht 60.0 in | Wt 138.0 lb

## 2018-03-26 DIAGNOSIS — Z23 Encounter for immunization: Secondary | ICD-10-CM | POA: Diagnosis not present

## 2018-03-26 DIAGNOSIS — Z9189 Other specified personal risk factors, not elsewhere classified: Secondary | ICD-10-CM

## 2018-03-26 DIAGNOSIS — Z01419 Encounter for gynecological examination (general) (routine) without abnormal findings: Secondary | ICD-10-CM | POA: Diagnosis not present

## 2018-03-26 DIAGNOSIS — Z13220 Encounter for screening for lipoid disorders: Secondary | ICD-10-CM

## 2018-03-26 DIAGNOSIS — N951 Menopausal and female climacteric states: Secondary | ICD-10-CM | POA: Diagnosis not present

## 2018-03-26 NOTE — Patient Instructions (Signed)
Follow-up in 1 year for annual exam, sooner if any issues. 

## 2018-03-26 NOTE — Progress Notes (Signed)
    Evelyn Castro 1966/05/28 [id number]        52 y.o.  G0P0 for annual gynecologic exam.  Is having some hot flushes and sweats.  Past medical history,surgical history, problem list, medications, allergies, family history and social history were all reviewed and documented as reviewed in the EPIC chart.  ROS:  Performed with pertinent positives and negatives included in the history, assessment and plan.   Additional significant findings : None   Exam: [name] assistant Vitals:   03/26/18 0825  BP: 118/76  Weight: 138 lb (62.6 kg)  Height: 5' (1.524 m)   Body mass index is 26.95 kg/m.  General appearance:  Normal affect, orientation and appearance. Skin: Grossly normal HEENT: Without gross lesions.  No cervical or supraclavicular adenopathy. Thyroid normal.  Lungs:  Clear without wheezing, rales or rhonchi Cardiac: RR, without RMG Abdominal:  Soft, nontender, without masses, guarding, rebound, organomegaly or hernia Breasts:  Examined lying and sitting without masses, retractions, discharge or axillary adenopathy. Pelvic:  Ext, BUS, Vagina: Normal with mild atrophic changes.  Pap smear of vaginal cuff done  Adnexa: Without masses or tenderness    Anus and perineum: Normal   Rectovaginal: Normal sphincter tone without palpated masses or tenderness.    Assessment/Plan:  52 y.o. G0P0 female for annual gynecologic exam.   1. Perimenopause.  Status post TVH for leiomyoma and endometriosis 2012.  [place] 24 last year.  Starting to have more hot flushes and sweats.  We reviewed what to expect in the perimenopause.  Options for management discussed with her history of breast cancer.  Possibilities for Effexor if significant symptoms reviewed.  At this point she does not feel her symptoms warrant treatment but will follow-up if they worsen. 2. 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  Pap smear of vaginal cuff done today.  History of glandular cells on Pap smear 2017.  Colposcopy biopsy showed  slight koilocytotic atypia.  Pap smear reviewed by pathologist and felt to represent benign endometrial type cells consistent with her history of endometriosis.  Follow-up Pap smear 2018 normal.  Will continue with annual cytology. 3. History of carcinoma in situ right breast status post lumpectomy.  Exam NED.  Mammography 01/2018.  Continues on tamoxifen.  Continue follow-up with her oncologist. 4. Colonoscopy 2019.  Repeat at their recommended interval. 5. Pap smear never.  Will plan further into the menopause. 6. Health maintenance.  Requests baseline labs.  CBC, CMP, lipid profile, TSH and vitamin D level done.  Follow-up in 1 year, sooner as needed.   [name] MD, 8:46 AM 03/26/2018

## 2018-03-26 NOTE — Addendum Note (Signed)
Addended by: Nelva Nay on: 03/26/2018 09:43 AM   Modules accepted: Orders

## 2018-03-27 LAB — CBC WITH DIFFERENTIAL/PLATELET
BASOS ABS: 61 {cells}/uL (ref 0–200)
Basophils Relative: 1.2 %
EOS ABS: 133 {cells}/uL (ref 15–500)
Eosinophils Relative: 2.6 %
HEMATOCRIT: 36.8 % (ref 35.0–45.0)
HEMOGLOBIN: 12.5 g/dL (ref 11.7–15.5)
LYMPHS ABS: 1275 {cells}/uL (ref 850–3900)
MCH: 29.8 pg (ref 27.0–33.0)
MCHC: 34 g/dL (ref 32.0–36.0)
MCV: 87.8 fL (ref 80.0–100.0)
MPV: 9.1 fL (ref 7.5–12.5)
Monocytes Relative: 6.9 %
NEUTROS ABS: 3279 {cells}/uL (ref 1500–7800)
NEUTROS PCT: 64.3 %
Platelets: 229 10*3/uL (ref 140–400)
RBC: 4.19 10*6/uL (ref 3.80–5.10)
RDW: 13.1 % (ref 11.0–15.0)
Total Lymphocyte: 25 %
WBC: 5.1 10*3/uL (ref 3.8–10.8)
WBCMIX: 352 {cells}/uL (ref 200–950)

## 2018-03-27 LAB — LIPID PANEL
Cholesterol: 173 mg/dL (ref <200)
HDL: 71 mg/dL (ref >50)
LDL Cholesterol (Calc): 85 mg/dL (calc)
Non-HDL Cholesterol (Calc): 102 mg/dL (calc) (ref <130)
Total CHOL/HDL Ratio: 2.4 (calc) (ref <5.0)
Triglycerides: 84 mg/dL (ref <150)

## 2018-03-27 LAB — COMPREHENSIVE METABOLIC PANEL
AG RATIO: 1.8 (calc) (ref 1.0–2.5)
ALKALINE PHOSPHATASE (APISO): 56 U/L (ref 33–130)
ALT: 17 U/L (ref 6–29)
AST: 20 U/L (ref 10–35)
Albumin: 4.2 g/dL (ref 3.6–5.1)
BILIRUBIN TOTAL: 0.4 mg/dL (ref 0.2–1.2)
BUN: 17 mg/dL (ref 7–25)
CALCIUM: 9.4 mg/dL (ref 8.6–10.4)
CO2: 24 mmol/L (ref 20–32)
Chloride: 106 mmol/L (ref 98–110)
Creat: 0.88 mg/dL (ref 0.50–1.05)
Globulin: 2.4 g/dL (calc) (ref 1.9–3.7)
Glucose, Bld: 99 mg/dL (ref 65–99)
POTASSIUM: 4.3 mmol/L (ref 3.5–5.3)
SODIUM: 140 mmol/L (ref 135–146)
Total Protein: 6.6 g/dL (ref 6.1–8.1)

## 2018-03-27 LAB — TSH: TSH: 1.9 m[IU]/L

## 2018-03-27 LAB — VITAMIN D 25 HYDROXY (VIT D DEFICIENCY, FRACTURES): VIT D 25 HYDROXY: 49 ng/mL (ref 30–100)

## 2018-03-31 LAB — PAP IG W/ RFLX HPV ASCU

## 2018-04-17 ENCOUNTER — Inpatient Hospital Stay: Payer: BLUE CROSS/BLUE SHIELD | Attending: Hematology and Oncology | Admitting: Hematology and Oncology

## 2018-04-17 ENCOUNTER — Telehealth: Payer: Self-pay | Admitting: Hematology and Oncology

## 2018-04-17 DIAGNOSIS — D0501 Lobular carcinoma in situ of right breast: Secondary | ICD-10-CM | POA: Diagnosis not present

## 2018-04-17 DIAGNOSIS — Z79810 Long term (current) use of selective estrogen receptor modulators (SERMs): Secondary | ICD-10-CM | POA: Insufficient documentation

## 2018-04-17 DIAGNOSIS — Z1231 Encounter for screening mammogram for malignant neoplasm of breast: Secondary | ICD-10-CM

## 2018-04-17 NOTE — Progress Notes (Signed)
Patient Care Team: Fontaine, Belinda Block, MD as PCP - General (Obstetrics and Gynecology)  DIAGNOSIS:  Encounter Diagnosis  Name Primary?  . 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  She denies any lumps or nodules in the breast. She completed 5 years of tamoxifen therapy.  She does have hot flashes from tamoxifen.  Since she had hysterectomy she is not sure whether she is in menopause.  REVIEW OF SYSTEMS:   Constitutional: Denies fevers, chills or abnormal weight loss Eyes: Denies blurriness of vision Ears, nose, mouth, throat, and face: Denies mucositis or sore throat Respiratory: Denies cough, dyspnea or wheezes Cardiovascular: Denies palpitation, chest discomfort Gastrointestinal:  Denies nausea, heartburn or change in bowel habits Skin: Denies abnormal skin rashes Lymphatics: Denies new lymphadenopathy or easy bruising Neurological:Denies numbness, tingling or new weaknesses Behavioral/Psych: Mood is stable, no new changes  Extremities: No lower extremity edema Breast:  denies any pain or lumps or nodules in either breasts All other systems were reviewed with the patient and are negative.  I have reviewed the past medical history, past surgical history, social history and family history with the patient and they are unchanged from previous note.  ALLERGIES:  has No Known Allergies.  MEDICATIONS:  Current Outpatient Medications  Medication Sig Dispense Refill  . Cholecalciferol (VITAMIN D PO) Take by mouth.    . DiphenhydrAMINE HCl (BENADRYL PO) Take 25 mg by mouth daily as needed. For allergy symptoms     . ibuprofen (ADVIL,MOTRIN) 200 MG tablet Take 200 mg by mouth 4 (four) times daily as needed. For menstrual cramps    . Multiple Vitamin  (MULTIVITAMIN) capsule Take 1 capsule by mouth daily.     . nitrofurantoin (MACRODANTIN) 100 MG capsule Take 1 capsule (100 mg total) by mouth as needed. After intercourse 30 capsule 3  . Pseudoephedrine HCl (SUDAFED 12 HOUR PO) Take by mouth.    . tamoxifen (NOLVADEX) 20 MG tablet Take 1 tablet (20 mg total) by mouth daily. 90 tablet 3   No current facility-administered medications for this visit.     PHYSICAL EXAMINATION: ECOG PERFORMANCE STATUS: 0 - Asymptomatic  Vitals:   04/17/18 0846  BP: 130/83  Pulse: 85  Resp: 18  Temp: 98.9 F (37.2 C)  SpO2: 100%   Filed Weights   04/17/18 0846  Weight: 141 lb 8 oz (64.2 kg)    GENERAL:alert, no distress and comfortable SKIN: skin color, texture, turgor are normal, no rashes or significant lesions EYES: normal, Conjunctiva are pink and non-injected, sclera clear OROPHARYNX:no exudate, no erythema and lips, buccal mucosa, and tongue normal  NECK: supple, thyroid normal size, non-tender, without nodularity LYMPH:  no palpable lymphadenopathy in the cervical, axillary or inguinal LUNGS: clear to auscultation and percussion with normal breathing effort HEART: regular rate & rhythm and no murmurs and no lower extremity edema ABDOMEN:abdomen soft, non-tender and normal bowel sounds MUSCULOSKELETAL:no cyanosis of digits and no clubbing  NEURO: alert & oriented x 3 with fluent speech, no focal motor/sensory deficits EXTREMITIES: No lower extremity edema BREAST: No palpable masses or nodules in either right or left breasts. No palpable axillary supraclavicular or infraclavicular adenopathy no breast tenderness or nipple discharge. (exam performed in the presence of a chaperone)  LABORATORY DATA:  I have  reviewed the data as listed CMP Latest Ref Rng & Units 03/26/2018 03/11/2017 01/29/2016  Glucose 65 - 99 mg/dL 99 102(H) 108(H)  BUN 7 - 25 mg/dL 17 11 10   Creatinine 0.50 - 1.05 mg/dL 0.88 0.84 0.86  Sodium 135 - 146 mmol/L 140 140 139    Potassium 3.5 - 5.3 mmol/L 4.3 4.2 4.1  Chloride 98 - 110 mmol/L 106 105 106  CO2 20 - 32 mmol/L 24 28 22   Calcium 8.6 - 10.4 mg/dL 9.4 8.9 9.2  Total Protein 6.1 - 8.1 g/dL 6.6 6.8 6.3  Total Bilirubin 0.2 - 1.2 mg/dL 0.4 0.3 0.3  Alkaline Phos 33 - 130 U/L - - 49  AST 10 - 35 U/L 20 19 18   ALT 6 - 29 U/L 17 22 15     Lab Results  Component Value Date   WBC 5.1 03/26/2018   HGB 12.5 03/26/2018   HCT 36.8 03/26/2018   MCV 87.8 03/26/2018   PLT 229 03/26/2018   NEUTROABS 3,279 03/26/2018    ASSESSMENT & PLAN:  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  Tamoxifen toxicities:Patient is tolerating tamoxifen extremely well without any major problems or concerns. She denies any hot flashes or muscle aches or pains other than the usual symptoms. She had hysterectomy previously.  I discussed with her that since she completed 5 years of tamoxifen therapy, additional continuation of tamoxifen is considered optional.  Based on this, she will stop tamoxifen at this time.  Breast Cancer Surveillance: 1. Breast exam  04/17/2018: Normal 2. Mammogram  02/23/2018 at [facility] No abnormalities. Postsurgical changes. Breast Density Category C.   [name] risk assessment: Patient's lifetime risk of breast cancer came at 65%.  10-year risk came back as 22%. Because she took tamoxifen I believe these risks are reduced in half. Based on these results I recommended annual breast MRI. We will set that up for March 2020.  Return to clinic in 1 year for follow-up   No orders of the defined types were placed in this encounter.  The patient has a good understanding of the overall plan. she agrees with it. she will call with any problems that may develop before the next visit here.   Harriette Ohara, MD 04/17/18

## 2018-04-17 NOTE — Telephone Encounter (Signed)
Gave pt avs and calendar  °

## 2018-04-17 NOTE — Assessment & Plan Note (Signed)
LCIS right breast: Status post lumpectomy now on breast cancer prevention with tamoxifen 20 mg daily started September 2014.  Tamoxifen toxicities:Patient is tolerating tamoxifen extremely well without any major problems or concerns. She denies any hot flashes or muscle aches or pains other than the usual symptoms. She had hysterectomy previously.  I discussed with her that since she completed 5 years of tamoxifen therapy, additional continuation of tamoxifen is considered optional.  Breast Cancer Surveillance: 1. Breast exam  04/17/2018: Normal 2. Mammogram  02/23/2018 at [facility] No abnormalities. Postsurgical changes. Breast Density Category C.   Return to clinic in 1 year for follow-up

## 2018-04-28 DIAGNOSIS — H35413 Lattice degeneration of retina, bilateral: Secondary | ICD-10-CM | POA: Diagnosis not present

## 2018-04-28 DIAGNOSIS — H43813 Vitreous degeneration, bilateral: Secondary | ICD-10-CM | POA: Diagnosis not present

## 2018-04-28 DIAGNOSIS — H33302 Unspecified retinal break, left eye: Secondary | ICD-10-CM | POA: Diagnosis not present

## 2018-09-18 ENCOUNTER — Telehealth: Payer: Self-pay | Admitting: *Deleted

## 2018-09-18 MED ORDER — NITROFURANTOIN MACROCRYSTAL 100 MG PO CAPS: 100.0000 mg | ORAL_CAPSULE | ORAL | 0 refills | Status: DC | PRN

## 2018-09-18 NOTE — Telephone Encounter (Signed)
Patient called requesting refill on Macrodantin 100 mg tablets # 30 to use after intercourse. Okay to refill?

## 2018-09-18 NOTE — Telephone Encounter (Signed)
Okay for refill number 30 tablets

## 2018-09-18 NOTE — Telephone Encounter (Signed)
Patient aware, Rx sent.  

## 2018-11-13 DIAGNOSIS — H25011 Cortical age-related cataract, right eye: Secondary | ICD-10-CM | POA: Diagnosis not present

## 2018-11-13 DIAGNOSIS — H35413 Lattice degeneration of retina, bilateral: Secondary | ICD-10-CM | POA: Diagnosis not present

## 2018-11-13 DIAGNOSIS — H524 Presbyopia: Secondary | ICD-10-CM | POA: Diagnosis not present

## 2019-03-02 ENCOUNTER — Encounter: Payer: Self-pay | Admitting: Gynecology

## 2019-03-02 DIAGNOSIS — Z853 Personal history of malignant neoplasm of breast: Secondary | ICD-10-CM | POA: Diagnosis not present

## 2019-03-02 DIAGNOSIS — Z1231 Encounter for screening mammogram for malignant neoplasm of breast: Secondary | ICD-10-CM | POA: Diagnosis not present

## 2019-03-24 ENCOUNTER — Encounter: Payer: Self-pay | Admitting: Gynecology

## 2019-03-31 ENCOUNTER — Encounter: Payer: BLUE CROSS/BLUE SHIELD | Admitting: Gynecology

## 2019-04-06 ENCOUNTER — Other Ambulatory Visit: Payer: Self-pay

## 2019-04-07 ENCOUNTER — Ambulatory Visit (INDEPENDENT_AMBULATORY_CARE_PROVIDER_SITE_OTHER): Payer: BC Managed Care – PPO | Admitting: Gynecology

## 2019-04-07 ENCOUNTER — Encounter: Payer: Self-pay | Admitting: Gynecology

## 2019-04-07 VITALS — BP 118/78 | Ht 60.0 in | Wt 144.0 lb

## 2019-04-07 DIAGNOSIS — Z01419 Encounter for gynecological examination (general) (routine) without abnormal findings: Secondary | ICD-10-CM

## 2019-04-07 DIAGNOSIS — N952 Postmenopausal atrophic vaginitis: Secondary | ICD-10-CM | POA: Diagnosis not present

## 2019-04-07 DIAGNOSIS — Z9189 Other specified personal risk factors, not elsewhere classified: Secondary | ICD-10-CM | POA: Diagnosis not present

## 2019-04-07 DIAGNOSIS — Z13220 Encounter for screening for lipoid disorders: Secondary | ICD-10-CM

## 2019-04-07 DIAGNOSIS — Z23 Encounter for immunization: Secondary | ICD-10-CM

## 2019-04-07 LAB — LIPID PANEL
Cholesterol: 218 mg/dL — ABNORMAL HIGH (ref <200)
HDL: 77 mg/dL (ref >=50)
LDL Cholesterol (Calc): 123 mg/dL (calc) — ABNORMAL HIGH
Non-HDL Cholesterol (Calc): 141 mg/dL (calc) — ABNORMAL HIGH (ref <130)
Total CHOL/HDL Ratio: 2.8 (calc) (ref <5.0)
Triglycerides: 83 mg/dL (ref <150)

## 2019-04-07 LAB — COMPREHENSIVE METABOLIC PANEL
AG Ratio: 1.6 (calc) (ref 1.0–2.5)
ALT: 12 U/L (ref 6–29)
AST: 18 U/L (ref 10–35)
Albumin: 4.3 g/dL (ref 3.6–5.1)
Alkaline phosphatase (APISO): 86 U/L (ref 37–153)
BUN: 12 mg/dL (ref 7–25)
CO2: 24 mmol/L (ref 20–32)
Calcium: 9 mg/dL (ref 8.6–10.4)
Chloride: 105 mmol/L (ref 98–110)
Creat: 0.77 mg/dL (ref 0.50–1.05)
Globulin: 2.7 g/dL (calc) (ref 1.9–3.7)
Glucose, Bld: 101 mg/dL — ABNORMAL HIGH (ref 65–99)
Potassium: 4.2 mmol/L (ref 3.5–5.3)
Sodium: 137 mmol/L (ref 135–146)
Total Bilirubin: 0.5 mg/dL (ref 0.2–1.2)
Total Protein: 7 g/dL (ref 6.1–8.1)

## 2019-04-07 LAB — CBC WITH DIFFERENTIAL/PLATELET
Absolute Monocytes: 350 cells/uL (ref 200–950)
Basophils Absolute: 70 cells/uL (ref 0–200)
Basophils Relative: 1.4 %
Eosinophils Absolute: 170 cells/uL (ref 15–500)
Eosinophils Relative: 3.4 %
HCT: 39.9 % (ref 35.0–45.0)
Hemoglobin: 13.4 g/dL (ref 11.7–15.5)
Lymphs Abs: 1085 cells/uL (ref 850–3900)
MCH: 29.9 pg (ref 27.0–33.0)
MCHC: 33.6 g/dL (ref 32.0–36.0)
MCV: 89.1 fL (ref 80.0–100.0)
MPV: 9.3 fL (ref 7.5–12.5)
Monocytes Relative: 7 %
Neutro Abs: 3325 cells/uL (ref 1500–7800)
Neutrophils Relative %: 66.5 %
Platelets: 222 10*3/uL (ref 140–400)
RBC: 4.48 10*6/uL (ref 3.80–5.10)
RDW: 13.3 % (ref 11.0–15.0)
Total Lymphocyte: 21.7 %
WBC: 5 10*3/uL (ref 3.8–10.8)

## 2019-04-07 NOTE — Progress Notes (Signed)
    Evelyn Castro 1965/08/09 [id number]        53 y.o.  G0P0 for annual gynecologic exam.  Without gynecologic complaints.  Not having significant menopausal symptoms.  Past medical history,surgical history, problem list, medications, allergies, family history and social history were all reviewed and documented as reviewed in the EPIC chart.  ROS:  Performed with pertinent positives and negatives included in the history, assessment and plan.   Additional significant findings : None   Exam: [name] assistant Vitals:   04/07/19 0831  BP: 118/78  Weight: 144 lb (65.3 kg)  Height: 5' (1.524 m)   Body mass index is 28.12 kg/m.  General appearance:  Normal affect, orientation and appearance. Skin: Grossly normal HEENT: Without gross lesions.  No cervical or supraclavicular adenopathy. Thyroid normal.  Lungs:  Clear without wheezing, rales or rhonchi Cardiac: RR, without RMG Abdominal:  Soft, nontender, without masses, guarding, rebound, organomegaly or hernia Breasts:  Examined lying and sitting without masses, retractions, discharge or axillary adenopathy. Pelvic:  Ext, BUS, Vagina: With mild atrophic changes.  Pap smear of vaginal cuff done  Adnexa: Without masses or tenderness    Anus and perineum: Normal   Rectovaginal: Normal sphincter tone without palpated masses or tenderness.    Assessment/Plan:  53 y.o. G0P0 female for annual gynecologic exam.  Status post TVH for leiomyoma and endometriosis 2012.  1. Perimenopausal.  FSH 24 two years ago.  Was having hot flushes and sweats starting last year.  They seem to be improving. 2. 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  Pap smear of vaginal cuff done today.  Glandular cells on Pap smear 2017.  Colposcopic biopsy showed koilocytotic atypia.  Pathologist felt these represented benign endometrial type cells consistent with her history of endometriosis.  Follow-up Pap smear 2018 and 19 normal. 3. Mammography 03/2019.  Continue with  annual mammography next year.  Breast exam normal today. 4. Colonoscopy 2019.  Repeat at their recommended interval. 5. DEXA never.  Will plan further into the menopause. 6. Health maintenance.  Baseline CBC, CMP and lipid profile ordered.  TSH and vitamin D normal last year.  Follow-up 1 year, sooner as needed.   [name] MD, 8:55 AM 04/07/2019

## 2019-04-07 NOTE — Patient Instructions (Signed)
Follow-up in 1 year for annual exam, sooner as needed. 

## 2019-04-07 NOTE — Addendum Note (Signed)
Addended by: Nelva Nay on: 04/07/2019 09:02 AM   Modules accepted: Orders

## 2019-04-12 LAB — PAP IG W/ RFLX HPV ASCU

## 2019-04-22 NOTE — Assessment & Plan Note (Signed)
LCIS right breast: Status post lumpectomy now on breast cancer prevention with tamoxifen 20 mg daily started September 2014 completed October 2019  Breast Cancer Surveillance: 1. Breast exam  04/22/2019: Benign 2. Mammogram  03/02/2019 at [facility] abnormalities. Postsurgical changes. Breast Density Category C.   Evelyn Castro risk assessment: Patient's lifetime risk of breast cancer came at 65%.  10-year risk came back as 22%.  Based on this we recommended breast MRIs every year.

## 2019-04-22 NOTE — Progress Notes (Signed)
Patient Care Team: Fontaine, Belinda Block, MD as PCP - General (Obstetrics and Gynecology)  DIAGNOSIS:    ICD-10-CM   1. 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 with above-mentioned history of LCIS treated with lumpectomy and 5 years of tamoxifen therapy who is currently on surveillance. I last saw her a year ago. Mammogram on 03/02/19 showed no evidence of malignancy bilaterally. She presents to the clinic today for annual follow-up.  She did not get MRI this year because of COVID-19 epidemic and will plan to get it next year.  She has not had any problems or concerns with her breasts.  She works from home so it has not affected her lifestyle too badly.  REVIEW OF SYSTEMS:   Constitutional: Denies fevers, chills or abnormal weight loss Eyes: Denies blurriness of vision Ears, nose, mouth, throat, and face: Denies mucositis or sore throat Respiratory: Denies cough, dyspnea or wheezes Cardiovascular: Denies palpitation, chest discomfort Gastrointestinal: Denies nausea, heartburn or change in bowel habits Skin: Denies abnormal skin rashes Lymphatics: Denies new lymphadenopathy or easy bruising Neurological: Denies numbness, tingling or new weaknesses Behavioral/Psych: Mood is stable, no new changes  Extremities: No lower extremity edema Breast: denies any pain or lumps or nodules in either breasts All other systems were reviewed with the patient and are negative.  I have reviewed the past medical history, past surgical history, social history and family history with the patient and they are unchanged from previous note.  ALLERGIES:  has No Known Allergies.  MEDICATIONS:  Current Outpatient Medications  Medication Sig Dispense Refill  . Cholecalciferol (VITAMIN D PO) Take by mouth.    . DiphenhydrAMINE HCl (BENADRYL PO) Take 25 mg by mouth daily as needed. For allergy symptoms     .  ibuprofen (ADVIL,MOTRIN) 200 MG tablet Take 200 mg by mouth 4 (four) times daily as needed. For menstrual cramps    . Multiple Vitamin (MULTIVITAMIN) capsule Take 1 capsule by mouth daily.     . nitrofurantoin (MACRODANTIN) 100 MG capsule Take 1 capsule (100 mg total) by mouth as needed. After intercourse 30 capsule 0  . Pseudoephedrine HCl (SUDAFED 12 HOUR PO) Take by mouth.     No current facility-administered medications for this visit.     PHYSICAL EXAMINATION: ECOG PERFORMANCE STATUS: 0 - Asymptomatic  Vitals:   04/23/19 0855  BP: (!) 150/89  Pulse: 72  Resp: 18  Temp: 97.8 F (36.6 C)  SpO2: 99%   Filed Weights   04/23/19 0855  Weight: 147 lb 8 oz (66.9 kg)    GENERAL: alert, no distress and comfortable SKIN: skin color, texture, turgor are normal, no rashes or significant lesions EYES: normal, Conjunctiva are pink and non-injected, sclera clear OROPHARYNX: no exudate, no erythema and lips, buccal mucosa, and tongue normal  NECK: supple, thyroid normal size, non-tender, without nodularity LYMPH: no palpable lymphadenopathy in the cervical, axillary or inguinal LUNGS: clear to auscultation and percussion with normal breathing effort HEART: regular rate & rhythm and no murmurs and no lower extremity edema ABDOMEN: abdomen soft, non-tender and normal bowel sounds MUSCULOSKELETAL: no cyanosis of digits and no clubbing  NEURO: alert & oriented x 3 with fluent speech, no focal motor/sensory deficits EXTREMITIES: No lower extremity edema BREAST: No palpable masses or nodules in either right or left breasts. No palpable axillary supraclavicular or infraclavicular adenopathy no breast tenderness or nipple  discharge. (exam performed in the presence of a chaperone)  LABORATORY DATA:  I have reviewed the data as listed CMP Latest Ref Rng & Units 04/07/2019 03/26/2018 03/11/2017  Glucose 65 - 99 mg/dL 101(H) 99 102(H)  BUN 7 - 25 mg/dL 12 17 11   Creatinine 0.50 - 1.05 mg/dL 0.77  0.88 0.84  Sodium 135 - 146 mmol/L 137 140 140  Potassium 3.5 - 5.3 mmol/L 4.2 4.3 4.2  Chloride 98 - 110 mmol/L 105 106 105  CO2 20 - 32 mmol/L 24 24 28   Calcium 8.6 - 10.4 mg/dL 9.0 9.4 8.9  Total Protein 6.1 - 8.1 g/dL 7.0 6.6 6.8  Total Bilirubin 0.2 - 1.2 mg/dL 0.5 0.4 0.3  Alkaline Phos 33 - 130 U/L - - -  AST 10 - 35 U/L 18 20 19   ALT 6 - 29 U/L 12 17 22     Lab Results  Component Value Date   WBC 5.0 04/07/2019   HGB 13.4 04/07/2019   HCT 39.9 04/07/2019   MCV 89.1 04/07/2019   PLT 222 04/07/2019   NEUTROABS 3,325 04/07/2019    ASSESSMENT & PLAN:  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 Breast exam  04/22/2019: Benign 2. Mammogram  03/02/2019 at [facility] abnormalities. Postsurgical changes. Breast Density Category C.   [name] risk assessment: Patient's lifetime risk of breast cancer came at 65%.  10-year risk came back as 22%.  Based on this we recommended breast MRIs every year. This year MRI could not be done because of pandemic.  We will simply postpone it till next year.  She will have it in March 2021.   No orders of the defined types were placed in this encounter.  The patient has a good understanding of the overall plan. she agrees with it. she will call with any problems that may develop before the next visit here.  Nicholas Lose, MD 04/23/2019  Julious Oka Dorshimer am acting as scribe for Dr. Nicholas Lose.  I have reviewed the above documentation for accuracy and completeness, and I agree with the above.

## 2019-04-23 ENCOUNTER — Inpatient Hospital Stay: Payer: BC Managed Care – PPO | Attending: Hematology and Oncology | Admitting: Hematology and Oncology

## 2019-04-23 ENCOUNTER — Other Ambulatory Visit: Payer: Self-pay

## 2019-04-23 ENCOUNTER — Telehealth: Payer: Self-pay | Admitting: Hematology and Oncology

## 2019-04-23 DIAGNOSIS — Z86000 Personal history of in-situ neoplasm of breast: Secondary | ICD-10-CM | POA: Diagnosis not present

## 2019-04-23 DIAGNOSIS — D0501 Lobular carcinoma in situ of right breast: Secondary | ICD-10-CM

## 2019-04-23 NOTE — Telephone Encounter (Signed)
I left a message regarding schedule  

## 2019-08-11 DIAGNOSIS — L819 Disorder of pigmentation, unspecified: Secondary | ICD-10-CM | POA: Diagnosis not present

## 2019-08-11 DIAGNOSIS — L570 Actinic keratosis: Secondary | ICD-10-CM | POA: Diagnosis not present

## 2019-08-11 DIAGNOSIS — D225 Melanocytic nevi of trunk: Secondary | ICD-10-CM | POA: Diagnosis not present

## 2019-08-11 DIAGNOSIS — D2261 Melanocytic nevi of right upper limb, including shoulder: Secondary | ICD-10-CM | POA: Diagnosis not present

## 2019-08-21 ENCOUNTER — Ambulatory Visit: Payer: BC Managed Care – PPO

## 2019-11-19 DIAGNOSIS — H35413 Lattice degeneration of retina, bilateral: Secondary | ICD-10-CM | POA: Diagnosis not present

## 2019-11-19 DIAGNOSIS — H10413 Chronic giant papillary conjunctivitis, bilateral: Secondary | ICD-10-CM | POA: Diagnosis not present

## 2020-03-17 ENCOUNTER — Encounter: Payer: Self-pay | Admitting: Obstetrics and Gynecology

## 2020-03-17 DIAGNOSIS — Z1231 Encounter for screening mammogram for malignant neoplasm of breast: Secondary | ICD-10-CM | POA: Diagnosis not present

## 2020-04-20 ENCOUNTER — Encounter: Payer: BC Managed Care – PPO | Admitting: Obstetrics and Gynecology

## 2020-04-23 NOTE — Progress Notes (Signed)
Patient Care Team: Evelyn Castro, Evelyn Block, MD (Inactive) as PCP - General (Obstetrics and Gynecology)  DIAGNOSIS:    ICD-10-CM   1. 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 with above-mentioned history of LCIS treated with lumpectomy and 5 years of tamoxifen therapy who is currently on surveillance. Mammogram on 03/17/20 showed no evidence of malignancy bilaterally. She presents to the clinic today for annual follow-up.    ALLERGIES:  has No Known Allergies.  MEDICATIONS:  Current Outpatient Medications  Medication Sig Dispense Refill  . Cholecalciferol (VITAMIN D PO) Take by mouth.    . DiphenhydrAMINE HCl (BENADRYL PO) Take 25 mg by mouth daily as needed. For allergy symptoms     . ibuprofen (ADVIL,MOTRIN) 200 MG tablet Take 200 mg by mouth 4 (four) times daily as needed. For menstrual cramps    . Multiple Vitamin (MULTIVITAMIN) capsule Take 1 capsule by mouth daily.     . nitrofurantoin (MACRODANTIN) 100 MG capsule Take 1 capsule (100 mg total) by mouth as needed. After intercourse 30 capsule 0  . Pseudoephedrine HCl (SUDAFED 12 HOUR PO) Take by mouth.     No current facility-administered medications for this visit.    PHYSICAL EXAMINATION: ECOG PERFORMANCE STATUS: 1 - Symptomatic but completely ambulatory  Vitals:   04/24/20 0902  BP: (!) 159/79  Pulse: 80  Resp: 17  Temp: 97.6 F (36.4 C)  SpO2: 98%   Filed Weights   04/24/20 0902  Weight: 138 lb 9.6 oz (62.9 kg)    BREAST: No palpable masses or nodules in either right or left breasts. No palpable axillary supraclavicular or infraclavicular adenopathy no breast tenderness or nipple discharge. (exam performed in the presence of a chaperone)  LABORATORY DATA:  I have reviewed the data as listed CMP Latest Ref Rng & Units 04/07/2019 03/26/2018 03/11/2017  Glucose 65 - 99 mg/dL 101(H) 99 102(H)  BUN 7 - 25 mg/dL 12 17 11     Creatinine 0.50 - 1.05 mg/dL 0.77 0.88 0.84  Sodium 135 - 146 mmol/L 137 140 140  Potassium 3.5 - 5.3 mmol/L 4.2 4.3 4.2  Chloride 98 - 110 mmol/L 105 106 105  CO2 20 - 32 mmol/L 24 24 28   Calcium 8.6 - 10.4 mg/dL 9.0 9.4 8.9  Total Protein 6.1 - 8.1 g/dL 7.0 6.6 6.8  Total Bilirubin 0.2 - 1.2 mg/dL 0.5 0.4 0.3  Alkaline Phos 33 - 130 U/L - - -  AST 10 - 35 U/L 18 20 19   ALT 6 - 29 U/L 12 17 22     Lab Results  Component Value Date   WBC 5.0 04/07/2019   HGB 13.4 04/07/2019   HCT 39.9 04/07/2019   MCV 89.1 04/07/2019   PLT 222 04/07/2019   NEUTROABS 3,325 04/07/2019    ASSESSMENT & PLAN:  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 Breast exam10/25/2021: Benign 2. Mammogram9/17/2021 at [facility] abnormalities. Postsurgical changes. Breast Density Category C.  [name] risk assessment: Patient's lifetime risk of breast cancer came at 65%. 10-year risk came back as22%.  Even though recommended MRI breast surveillance, she has not done these MRIs over the past several years. Probably because of the COVID-19 pandemic. Her boyfriend lives in [place] and operates  a dive shop for tourists  I will set her up for a breast MRI to be done in March 2022.   No orders of the defined types were placed in this encounter.  The patient has a good understanding of the overall plan. she agrees with it. she will call with any problems that may develop before the next visit here.  Total time spent: 20 mins including face to face time and time spent for planning, charting and coordination of care  Nicholas Lose, MD 04/24/2020  I, Cloyde Reams Dorshimer, am acting as scribe for Dr. Nicholas Lose.  I have reviewed the above documentation for accuracy and completeness, and I agree with the above.

## 2020-04-24 ENCOUNTER — Inpatient Hospital Stay: Payer: BC Managed Care – PPO | Attending: Hematology and Oncology | Admitting: Hematology and Oncology

## 2020-04-24 ENCOUNTER — Other Ambulatory Visit: Payer: Self-pay

## 2020-04-24 DIAGNOSIS — Z79810 Long term (current) use of selective estrogen receptor modulators (SERMs): Secondary | ICD-10-CM | POA: Diagnosis not present

## 2020-04-24 DIAGNOSIS — D0501 Lobular carcinoma in situ of right breast: Secondary | ICD-10-CM | POA: Diagnosis not present

## 2020-04-24 NOTE — Assessment & Plan Note (Signed)
LCIS right breast: Status post lumpectomy now on breast cancer prevention with tamoxifen 20 mg daily started September 2014 completed October 2019  Breast Cancer Surveillance: 1. Breast exam10/25/2021: Benign 2. Mammogram9/17/2021 at [facility] abnormalities. Postsurgical changes. Breast Density Category C.  [name] risk assessment: Patient's lifetime risk of breast cancer came at 65%. 10-year risk came back as22%.  Even though recommended MRI breast surveillance, she has not done these MRIs over the past several years.

## 2020-04-28 ENCOUNTER — Ambulatory Visit (INDEPENDENT_AMBULATORY_CARE_PROVIDER_SITE_OTHER): Payer: BC Managed Care – PPO | Admitting: Obstetrics and Gynecology

## 2020-04-28 ENCOUNTER — Other Ambulatory Visit: Payer: Self-pay

## 2020-04-28 ENCOUNTER — Encounter: Payer: Self-pay | Admitting: Obstetrics and Gynecology

## 2020-04-28 VITALS — BP 122/80 | Ht 60.0 in | Wt 137.0 lb

## 2020-04-28 DIAGNOSIS — Z9189 Other specified personal risk factors, not elsewhere classified: Secondary | ICD-10-CM

## 2020-04-28 DIAGNOSIS — Z13220 Encounter for screening for lipoid disorders: Secondary | ICD-10-CM | POA: Diagnosis not present

## 2020-04-28 DIAGNOSIS — Z1272 Encounter for screening for malignant neoplasm of vagina: Secondary | ICD-10-CM

## 2020-04-28 DIAGNOSIS — Z01419 Encounter for gynecological examination (general) (routine) without abnormal findings: Secondary | ICD-10-CM | POA: Diagnosis not present

## 2020-04-28 NOTE — Progress Notes (Signed)
   Evelyn Castro 23-Nov-1965 [id number]  SUBJECTIVE:  54 y.o. G0P0000 female for annual routine gynecologic exam and Pap smear. She has no gynecologic concerns.  Current Outpatient Medications  Medication Sig Dispense Refill  . Cholecalciferol (VITAMIN D PO) Take by mouth.    . DiphenhydrAMINE HCl (BENADRYL PO) Take 25 mg by mouth daily as needed. For allergy symptoms     . ibuprofen (ADVIL,MOTRIN) 200 MG tablet Take 200 mg by mouth 4 (four) times daily as needed. For menstrual cramps    . nitrofurantoin (MACRODANTIN) 100 MG capsule Take 1 capsule (100 mg total) by mouth as needed. After intercourse 30 capsule 0  . Pseudoephedrine HCl (SUDAFED 12 HOUR PO) Take by mouth.     No current facility-administered medications for this visit.   Allergies: Patient has no known allergies.  Patient's last menstrual period was 02/17/2011.  Past medical history,surgical history, problem list, medications, allergies, family history and social history were all reviewed and documented as reviewed in the EPIC chart.  ROS: Pertinent positives and negatives as reviewed in HPI    OBJECTIVE:  BP 122/80 (BP Location: Right Arm, Patient Position: Sitting, Cuff Size: Normal)   Ht 5' (1.524 m)   Wt 137 lb (62.1 kg)   LMP 02/17/2011   BMI 26.76 kg/m  The patient appears well, alert, oriented, in no distress. ENT normal.  Neck supple. No cervical or supraclavicular adenopathy or thyromegaly.  Lungs are clear, good air entry, no wheezes, rhonchi or rales. S1 and S2 normal, no murmurs, regular rate and rhythm.  Abdomen soft without tenderness, guarding, mass or organomegaly.  Neurological is normal, no focal findings.  BREAST EXAM: breasts appear normal, no suspicious masses, no skin or nipple changes or axillary nodes  PELVIC EXAM: VULVA: normal appearing vulva with mild atrophic change, no masses, tenderness or lesions, VAGINA: normal appearing vagina with mild atrophic change, normal color and  discharge, no lesions, CERVIX: surgically absent, UTERUS: surgically absent, vaginal cuff well healed, ADNEXA: normal adnexa in size, nontender and no masses  Chaperone: [name] Bonham present during the examination  ASSESSMENT:  54 y.o. G0P0000 here for annual gynecologic exam  PLAN:   1. Postmenopausal. Prior TVH for leiomyoma and endometriosis in 2012. No significant hot flashes or night sweats. No vaginal bleeding. 2. 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 Pap smear 04/2019. 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 Normal Pap smears each year 2018, 2019, 2020. Continuing with annual Pap smears and collected Pap smear today. 3. History of right breast cancer.  Mammogram 03/2020.  Normal breast exam today. Continue with annual mammograms. Does also see Dr. Lindi Adie.  4. Colonoscopy 2019.  Recommended that she follow up at the recommended interval.   5. DEXA never. Would recommend further into menopause. 6. Health maintenance.  She will proceed to lab today for routine screening blood work (lipids, CBC, CMP). Normal vitamin D and TSH screens in 2019.  Return annually or sooner, prn.  Joseph Pierini MD 04/28/20

## 2020-04-29 LAB — CBC
HCT: 42.7 % (ref 35.0–45.0)
Hemoglobin: 14.1 g/dL (ref 11.7–15.5)
MCH: 29.6 pg (ref 27.0–33.0)
MCHC: 33 g/dL (ref 32.0–36.0)
MCV: 89.7 fL (ref 80.0–100.0)
MPV: 9.5 fL (ref 7.5–12.5)
Platelets: 222 10*3/uL (ref 140–400)
RBC: 4.76 10*6/uL (ref 3.80–5.10)
RDW: 13 % (ref 11.0–15.0)
WBC: 3.2 10*3/uL — ABNORMAL LOW (ref 3.8–10.8)

## 2020-04-29 LAB — COMPREHENSIVE METABOLIC PANEL
AG Ratio: 1.8 (calc) (ref 1.0–2.5)
ALT: 11 U/L (ref 6–29)
AST: 15 U/L (ref 10–35)
Albumin: 4.4 g/dL (ref 3.6–5.1)
Alkaline phosphatase (APISO): 80 U/L (ref 37–153)
BUN: 13 mg/dL (ref 7–25)
CO2: 22 mmol/L (ref 20–32)
Calcium: 9.4 mg/dL (ref 8.6–10.4)
Chloride: 104 mmol/L (ref 98–110)
Creat: 0.78 mg/dL (ref 0.50–1.05)
Globulin: 2.5 g/dL (calc) (ref 1.9–3.7)
Glucose, Bld: 93 mg/dL (ref 65–99)
Potassium: 4.6 mmol/L (ref 3.5–5.3)
Sodium: 138 mmol/L (ref 135–146)
Total Bilirubin: 0.4 mg/dL (ref 0.2–1.2)
Total Protein: 6.9 g/dL (ref 6.1–8.1)

## 2020-04-29 LAB — LIPID PANEL
Cholesterol: 222 mg/dL — ABNORMAL HIGH (ref <200)
HDL: 86 mg/dL (ref >=50)
LDL Cholesterol (Calc): 120 mg/dL (calc) — ABNORMAL HIGH
Non-HDL Cholesterol (Calc): 136 mg/dL (calc) — ABNORMAL HIGH (ref <130)
Total CHOL/HDL Ratio: 2.6 (calc) (ref <5.0)
Triglycerides: 66 mg/dL (ref <150)

## 2020-05-02 LAB — PAP IG W/ RFLX HPV ASCU

## 2020-06-16 ENCOUNTER — Ambulatory Visit: Payer: BC Managed Care – PPO

## 2020-06-16 ENCOUNTER — Ambulatory Visit: Payer: BC Managed Care – PPO | Attending: Internal Medicine

## 2020-06-16 DIAGNOSIS — Z23 Encounter for immunization: Secondary | ICD-10-CM

## 2020-06-16 NOTE — Progress Notes (Signed)
   Covid-19 Vaccination Clinic  Name:  TONNETTE ZWIEBEL    MRN: [id number] DOB: [birth year]  06/16/2020  Ms. Peasley was observed post Covid-19 immunization for 15 minutes without incident. She was provided with Vaccine Information Sheet and instruction to access the V-Safe system.   Ms. Gatling was instructed to call 911 with any severe reactions post vaccine: Marland Kitchen Difficulty breathing  . Swelling of face and throat  . A fast heartbeat  . A bad rash all over body  . Dizziness and weakness   Immunizations Administered    Name Date Dose VIS Date Route   Pfizer COVID-19 Vaccine 06/16/2020  5:20 PM 0.3 mL 04/19/2020 Intramuscular   Manufacturer: [place]   Lot: [id number]   [place]: [id number]

## 2020-06-17 ENCOUNTER — Ambulatory Visit: Payer: BC Managed Care – PPO

## 2020-08-16 DIAGNOSIS — D2261 Melanocytic nevi of right upper limb, including shoulder: Secondary | ICD-10-CM | POA: Diagnosis not present

## 2020-08-16 DIAGNOSIS — D1801 Hemangioma of skin and subcutaneous tissue: Secondary | ICD-10-CM | POA: Diagnosis not present

## 2020-08-16 DIAGNOSIS — D3612 Benign neoplasm of peripheral nerves and autonomic nervous system, upper limb, including shoulder: Secondary | ICD-10-CM | POA: Diagnosis not present

## 2020-08-16 DIAGNOSIS — D485 Neoplasm of uncertain behavior of skin: Secondary | ICD-10-CM | POA: Diagnosis not present

## 2020-08-16 DIAGNOSIS — L821 Other seborrheic keratosis: Secondary | ICD-10-CM | POA: Diagnosis not present

## 2020-09-22 ENCOUNTER — Ambulatory Visit
Admission: RE | Admit: 2020-09-22 | Discharge: 2020-09-22 | Disposition: A | Discharge status: Home / Self Care | Payer: BC Managed Care – PPO | Source: Ambulatory Visit | Attending: Hematology and Oncology | Admitting: Hematology and Oncology

## 2020-09-22 ENCOUNTER — Other Ambulatory Visit: Payer: Self-pay

## 2020-09-22 DIAGNOSIS — D0501 Lobular carcinoma in situ of right breast: Secondary | ICD-10-CM | POA: Diagnosis not present

## 2020-09-22 MED ORDER — GADOBUTROL 1 MMOL/ML IV SOLN
6.0000 mL | Freq: Once | INTRAVENOUS | Status: AC | PRN
  Administered 2020-09-22: 6 mL via INTRAVENOUS

## 2020-11-22 DIAGNOSIS — H2513 Age-related nuclear cataract, bilateral: Secondary | ICD-10-CM | POA: Diagnosis not present

## 2020-11-22 DIAGNOSIS — H5213 Myopia, bilateral: Secondary | ICD-10-CM | POA: Diagnosis not present

## 2020-11-22 DIAGNOSIS — H35413 Lattice degeneration of retina, bilateral: Secondary | ICD-10-CM | POA: Diagnosis not present

## 2021-04-02 ENCOUNTER — Encounter: Payer: Self-pay | Admitting: Obstetrics & Gynecology

## 2021-04-23 NOTE — Progress Notes (Signed)
Patient Care Team: Patient, No Pcp Per (Inactive) as PCP - General (General Practice)  DIAGNOSIS:    ICD-10-CM   1. 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 with above-mentioned history of LCIS treated with lumpectomy and 5 years of tamoxifen therapy who is currently on surveillance. Mammogram on 04/02/2021 showed no evidence of malignancy bilaterally. She presents to the clinic today for annual follow-up.  She does not have any complaints or concerns.  Denies any lumps or nodules in the breast.  ALLERGIES:  has No Known Allergies.  MEDICATIONS:  Current Outpatient Medications  Medication Sig Dispense Refill   Cholecalciferol (VITAMIN D PO) Take by mouth.     DiphenhydrAMINE HCl (BENADRYL PO) Take 25 mg by mouth daily as needed. For allergy symptoms      ibuprofen (ADVIL,MOTRIN) 200 MG tablet Take 200 mg by mouth 4 (four) times daily as needed. For menstrual cramps     nitrofurantoin (MACRODANTIN) 100 MG capsule Take 1 capsule (100 mg total) by mouth as needed. After intercourse 30 capsule 0   Pseudoephedrine HCl (SUDAFED 12 HOUR PO) Take by mouth.     No current facility-administered medications for this visit.    PHYSICAL EXAMINATION: ECOG PERFORMANCE STATUS: 0 - Asymptomatic  Vitals:   04/24/21 0817  BP: (!) 156/87  Pulse: 83  Resp: 18  Temp: (!) 97.2 F (36.2 C)  SpO2: 100%   Filed Weights   04/24/21 0817  Weight: 141 lb 4.8 oz (64.1 kg)    BREAST: No palpable masses or nodules in either right or left breasts. No palpable axillary supraclavicular or infraclavicular adenopathy no breast tenderness or nipple discharge. (exam performed in the presence of a chaperone)  LABORATORY DATA:  I have reviewed the data as listed CMP Latest Ref Rng & Units 04/28/2020 04/07/2019 03/26/2018  Glucose 65 - 99 mg/dL 93 101(H) 99  BUN 7 - 25 mg/dL 13 12 17   Creatinine 0.50 - 1.05  mg/dL 0.78 0.77 0.88  Sodium 135 - 146 mmol/L 138 137 140  Potassium 3.5 - 5.3 mmol/L 4.6 4.2 4.3  Chloride 98 - 110 mmol/L 104 105 106  CO2 20 - 32 mmol/L 22 24 24   Calcium 8.6 - 10.4 mg/dL 9.4 9.0 9.4  Total Protein 6.1 - 8.1 g/dL 6.9 7.0 6.6  Total Bilirubin 0.2 - 1.2 mg/dL 0.4 0.5 0.4  Alkaline Phos 33 - 130 U/L - - -  AST 10 - 35 U/L 15 18 20   ALT 6 - 29 U/L 11 12 17     Lab Results  Component Value Date   WBC 3.2 (L) 04/28/2020   HGB 14.1 04/28/2020   HCT 42.7 04/28/2020   MCV 89.7 04/28/2020   PLT 222 04/28/2020   NEUTROABS 3,325 04/07/2019    ASSESSMENT & PLAN:  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 Breast exam 04/24/2021: Benign 2. breast MRI 09/25/20: Benign. Postsurgical changes. Breast Density Category C.  3.  Mammogram 03/29/2021 at [facility]: Benign breast density category C   Evelyn Castro risk assessment: Patient's lifetime risk of breast cancer came at 65%.  10-year risk came back as 22%.   Her boyfriend lives in [place] and operates a dive shop for tourists  We discussed about doing breast MRIs every other year. Return to clinic in 1 year for follow-up   No orders of the defined types were placed in this encounter.  The patient has a good understanding of the overall plan. she agrees with it. she will call with any problems that may develop before the next visit here.  Total time spent: 20 mins including face to face time and time spent for planning, charting and coordination of care  Rulon Eisenmenger, MD, MPH 04/24/2021  I, Thana Ates, am acting as scribe for Dr. Nicholas Lose.  I have reviewed the above documentation for accuracy and completeness, and I agree with the above.

## 2021-04-24 ENCOUNTER — Inpatient Hospital Stay: Payer: 59 | Attending: Hematology and Oncology | Admitting: Hematology and Oncology

## 2021-04-24 ENCOUNTER — Other Ambulatory Visit: Payer: Self-pay

## 2021-04-24 DIAGNOSIS — Z79810 Long term (current) use of selective estrogen receptor modulators (SERMs): Secondary | ICD-10-CM | POA: Diagnosis not present

## 2021-04-24 DIAGNOSIS — D0501 Lobular carcinoma in situ of right breast: Secondary | ICD-10-CM | POA: Diagnosis not present

## 2021-04-24 NOTE — Assessment & Plan Note (Signed)
LCIS right breast: Status post lumpectomy now on breast cancer prevention with tamoxifen 20 mg daily started September 2014completed October 2019  Breast Cancer Surveillance: 1. Breast exam10/25/2022:Benign 2. breast MRI3/28/22: Benign. Postsurgical changes. Breast Density Category C. 3.  Mammogram 03/29/2021 at [facility] Benign breast density category C  Tyler Cusick risk assessment: Patient's lifetime risk of breast cancer came at 65%. 10-year risk came back as22%.  Her boyfriend lives in [place] and operates a dive shop for tourists  We discussed about doing breast MRIs every other year.

## 2021-04-30 ENCOUNTER — Ambulatory Visit: Payer: BC Managed Care – PPO | Admitting: Obstetrics and Gynecology

## 2021-05-09 ENCOUNTER — Other Ambulatory Visit (HOSPITAL_COMMUNITY)
Admission: RE | Admit: 2021-05-09 | Discharge: 2021-05-09 | Disposition: A | Discharge status: Home / Self Care | Payer: 59 | Source: Ambulatory Visit | Attending: Obstetrics and Gynecology | Admitting: Obstetrics and Gynecology

## 2021-05-09 ENCOUNTER — Encounter: Payer: Self-pay | Admitting: Obstetrics & Gynecology

## 2021-05-09 ENCOUNTER — Other Ambulatory Visit: Payer: Self-pay

## 2021-05-09 ENCOUNTER — Ambulatory Visit (INDEPENDENT_AMBULATORY_CARE_PROVIDER_SITE_OTHER): Payer: 59 | Admitting: Obstetrics & Gynecology

## 2021-05-09 VITALS — BP 120/80 | HR 98 | Resp 16 | Ht 59.75 in | Wt 139.0 lb

## 2021-05-09 DIAGNOSIS — Z9189 Other specified personal risk factors, not elsewhere classified: Secondary | ICD-10-CM | POA: Diagnosis not present

## 2021-05-09 DIAGNOSIS — Z780 Asymptomatic menopausal state: Secondary | ICD-10-CM | POA: Diagnosis not present

## 2021-05-09 DIAGNOSIS — Z01419 Encounter for gynecological examination (general) (routine) without abnormal findings: Secondary | ICD-10-CM | POA: Diagnosis present

## 2021-05-09 DIAGNOSIS — Z1272 Encounter for screening for malignant neoplasm of vagina: Secondary | ICD-10-CM | POA: Diagnosis present

## 2021-05-09 DIAGNOSIS — Z90710 Acquired absence of both cervix and uterus: Secondary | ICD-10-CM

## 2021-05-09 DIAGNOSIS — D0501 Lobular carcinoma in situ of right breast: Secondary | ICD-10-CM

## 2021-05-09 NOTE — Progress Notes (Signed)
Evelyn Castro 1966/05/08 [id number]   History:    55 y.o. G0 Married  RP:  Established patient presenting for annual gyn exam   HPI: Postmenopausal. Prior TAH for leiomyoma and endometriosis in 2012. No significant hot flashes or night sweats. No vaginal bleeding.  Pap Neg 2021.  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  Breasts normal.  History of right breast cancer.  Mammogram 03/2021 Neg.  Followed by Dr. Lindi Castro. Colonoscopy 2019.  BMI 27.37.  Will increase fitness activities and lower calorie/carb diet.  Fasting health labs here today.   Past medical history,surgical history, family history and social history were all reviewed and documented in the EPIC chart.  Gynecologic History Patient's last menstrual period was 02/17/2011.  Obstetric History OB History  Gravida Para Term Preterm AB Living  0 0 0 0 0 0  SAB IAB Ectopic Multiple Live Births  0 0 0 0 0     ROS: A ROS was performed and pertinent positives and negatives are included in the history.  GENERAL: No fevers or chills. HEENT: No change in vision, no earache, sore throat or sinus congestion. NECK: No pain or stiffness. CARDIOVASCULAR: No chest pain or pressure. No palpitations. PULMONARY: No shortness of breath, cough or wheeze. GASTROINTESTINAL: No abdominal pain, nausea, vomiting or diarrhea, melena or bright red blood per rectum. GENITOURINARY: No urinary frequency, urgency, hesitancy or dysuria. MUSCULOSKELETAL: No joint or muscle pain, no back pain, no recent trauma. DERMATOLOGIC: No rash, no itching, no lesions. ENDOCRINE: No polyuria, polydipsia, no heat or cold intolerance. No recent change in weight. HEMATOLOGICAL: No anemia or easy bruising or bleeding. NEUROLOGIC: No headache, seizures, numbness, tingling or weakness. PSYCHIATRIC: No depression, no loss of interest in normal activity or change in sleep pattern.     Exam:   BP 120/80   Pulse 98   Resp 16   Ht 4' 11.75" (1.518 m)   Wt 139 lb (63 kg)   LMP  02/17/2011   BMI 27.37 kg/m   Body mass index is 27.37 kg/m.  General appearance : Well developed well nourished female. No acute distress HEENT: Eyes: no retinal hemorrhage or exudates,  Neck supple, trachea midline, no carotid bruits, no thyroidmegaly Lungs: Clear to auscultation, no rhonchi or wheezes, or rib retractions  Heart: Regular rate and rhythm, no murmurs or gallops Breast:Examined in sitting and supine position were symmetrical in appearance, no palpable masses or tenderness,  no skin retraction, no nipple inversion, no nipple discharge, no skin discoloration, no axillary or supraclavicular lymphadenopathy Abdomen: no palpable masses or tenderness, no rebound or guarding Extremities: no edema or skin discoloration or tenderness  Pelvic: Vulva: Normal             Vagina: No gross lesions or discharge.  Pap reflex done.  Cervix/Uterus absent  Adnexa  Without masses or tenderness  Anus: Normal   Assessment/Plan:  55 y.o. female for annual exam   1. Encounter for Papanicolaou smear of vagina as part of routine gynecological examination Postmenopausal. Prior TAH for leiomyoma and endometriosis in 2012. No significant hot flashes or night sweats. No vaginal bleeding.  Pap Neg 2021.  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  Breasts normal.  History of right breast cancer.  Mammogram 03/2021 Neg.  Followed by Dr. Lindi Castro. Colonoscopy 2019.  BMI 27.37.  Will increase fitness activities and lower calorie/carb diet.  Fasting health labs here today.  - CBC - Comp Met (CMET) - TSH - Lipid Profile - Vitamin  D 1,25 dihydroxy - Cytology - PAP( [facility])  2. DES exposure in utero Pap reflex done.  3. S/P TAH (total abdominal hysterectomy)  4. Postmenopause Well on no HRT.  5. 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  Evelyn Bruins MD, 8:21 AM 05/09/2021

## 2021-05-10 LAB — CYTOLOGY - PAP: Diagnosis: NEGATIVE

## 2021-05-13 LAB — VITAMIN D 1,25 DIHYDROXY
Vitamin D 1, 25 (OH)2 Total: 40 pg/mL (ref 18–72)
Vitamin D2 1, 25 (OH)2: <8 pg/mL
Vitamin D3 1, 25 (OH)2: 40 pg/mL

## 2021-05-13 LAB — CBC
HCT: 41.9 % (ref 35.0–45.0)
Hemoglobin: 14.2 g/dL (ref 11.7–15.5)
MCH: 30.1 pg (ref 27.0–33.0)
MCHC: 33.9 g/dL (ref 32.0–36.0)
MCV: 89 fL (ref 80.0–100.0)
MPV: 9.2 fL (ref 7.5–12.5)
Platelets: 247 10*3/uL (ref 140–400)
RBC: 4.71 10*6/uL (ref 3.80–5.10)
RDW: 13.1 % (ref 11.0–15.0)
WBC: 3.8 10*3/uL (ref 3.8–10.8)

## 2021-05-13 LAB — COMPREHENSIVE METABOLIC PANEL
AG Ratio: 1.7 (calc) (ref 1.0–2.5)
ALT: 15 U/L (ref 6–29)
AST: 16 U/L (ref 10–35)
Albumin: 4.5 g/dL (ref 3.6–5.1)
Alkaline phosphatase (APISO): 75 U/L (ref 37–153)
BUN: 14 mg/dL (ref 7–25)
CO2: 23 mmol/L (ref 20–32)
Calcium: 9.5 mg/dL (ref 8.6–10.4)
Chloride: 104 mmol/L (ref 98–110)
Creat: 0.69 mg/dL (ref 0.50–1.03)
Globulin: 2.7 g/dL (calc) (ref 1.9–3.7)
Glucose, Bld: 96 mg/dL (ref 65–99)
Potassium: 4.6 mmol/L (ref 3.5–5.3)
Sodium: 140 mmol/L (ref 135–146)
Total Bilirubin: 0.4 mg/dL (ref 0.2–1.2)
Total Protein: 7.2 g/dL (ref 6.1–8.1)

## 2021-05-13 LAB — LIPID PANEL
Cholesterol: 216 mg/dL — ABNORMAL HIGH (ref <200)
HDL: 79 mg/dL (ref >=50)
LDL Cholesterol (Calc): 121 mg/dL (calc) — ABNORMAL HIGH
Non-HDL Cholesterol (Calc): 137 mg/dL (calc) — ABNORMAL HIGH (ref <130)
Total CHOL/HDL Ratio: 2.7 (calc) (ref <5.0)
Triglycerides: 69 mg/dL (ref <150)

## 2021-05-13 LAB — TSH: TSH: 1.49 mIU/L (ref 0.40–4.50)

## 2021-08-20 IMAGING — MR MR BREAST BILAT WO/W CM
8 of 10 series · 33 of 48 positions shown · IV contrast (gadavist)
Comparison: Previous exam(s).

CLINICAL DATA: Biopsy proven lobular carcinoma in-situ in the right
breast in 3569 now on breast Cancer prevention with tamoxifen. Fiaschi
Tyago risk assessment: patient's lifetime risk of breast cancer is
65%. Ten year risk is 22%.

LABS:  None obtained at the time of imaging.
EXAM:
BILATERAL BREAST MRI WITH AND WITHOUT CONTRAST
TECHNIQUE: Multiplanar, multisequence MR images of both breasts were obtained
prior to and following the intravenous administration of 6 ml of
Gadavist

[Series 3: t2_tirm_tra ipat (a-p) · axial · 3.0mm · 0.70mm/px · 1 of 65 slices shown]
[im 1/65]
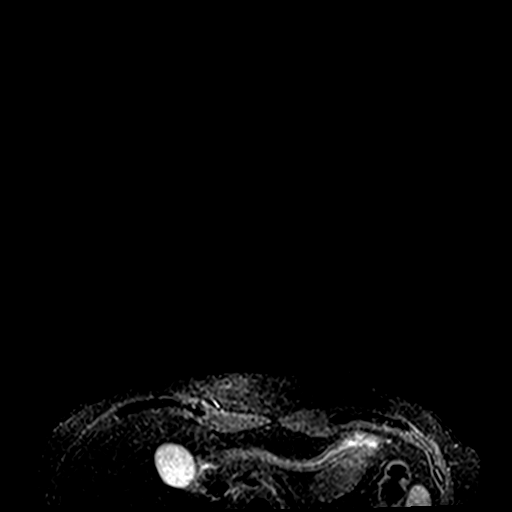

[Series 4: fl3d pre-cm no · axial · non-contrast · 1.2mm · 0.94mm/px · z∈[-80,+111]mm · 5 of 160 slices shown]
[im 1/160]
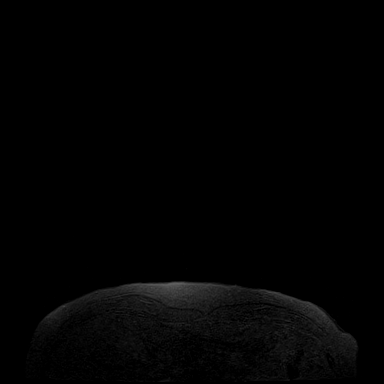
[im 40/160]
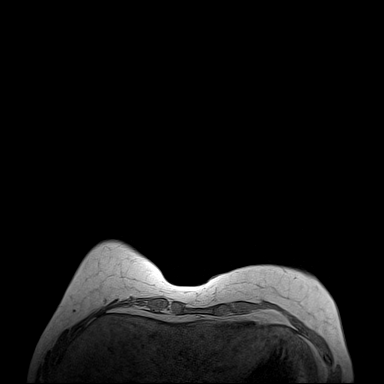
[im 80/160]
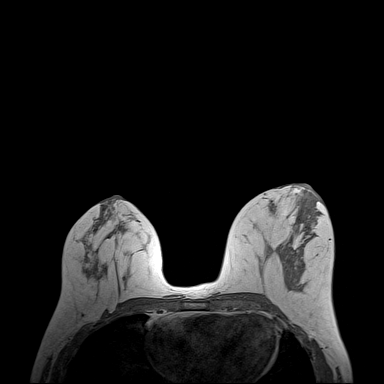
[im 120/160]
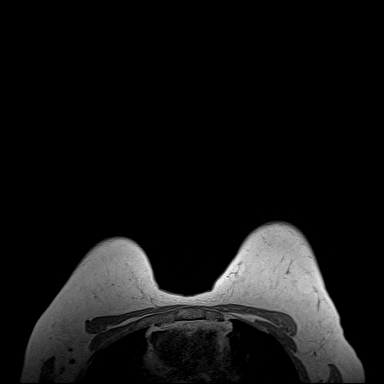
[im 160/160]
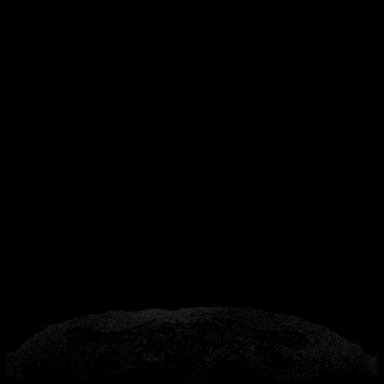

[Series 5: fl3d pre-cm · axial · non-contrast · 1.2mm · 0.94mm/px · z∈[-80,+111]mm · 5 of 160 slices shown]
[im 1/160]
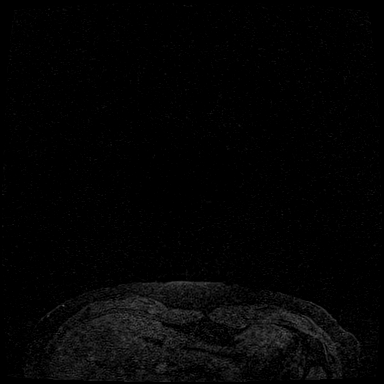
[im 40/160]
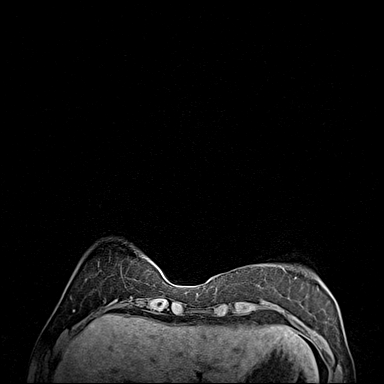
[im 80/160]
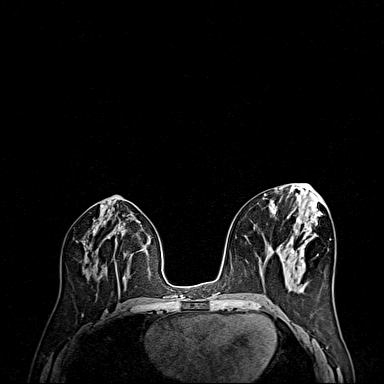
[im 120/160]
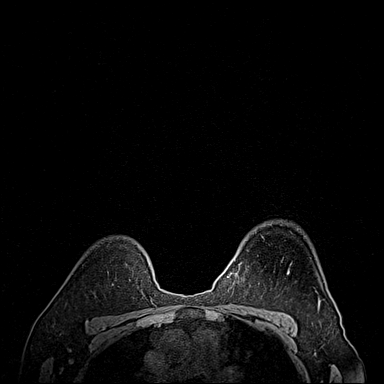
[im 160/160]
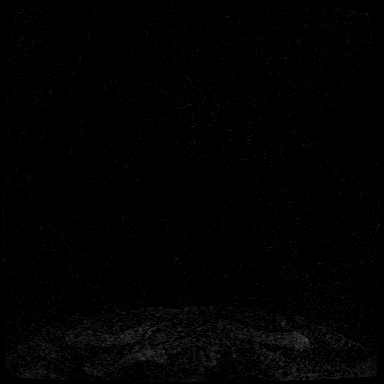

[Series 6: fl3d post immediate · axial · 1.2mm · 0.94mm/px · z∈[-80,+111]mm · 6 of 160 slices shown (1 of 3)]
[im 1/160]
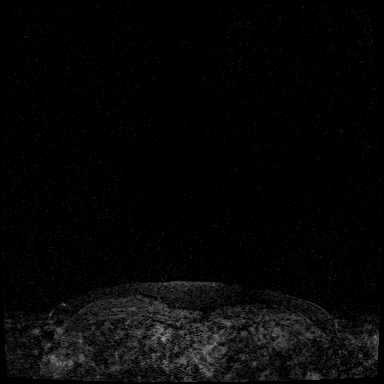
[im 32/160]
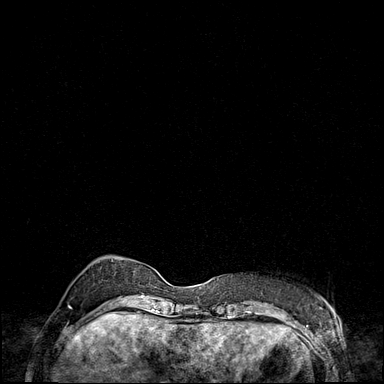
[im 64/160]
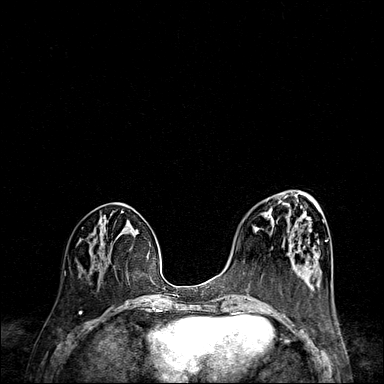
[im 96/160]
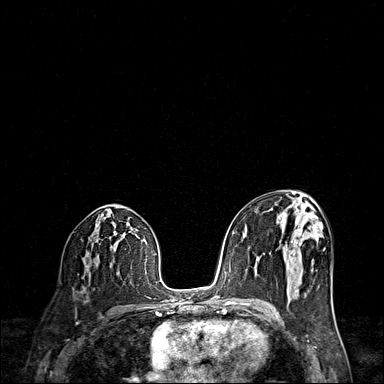
[im 128/160]
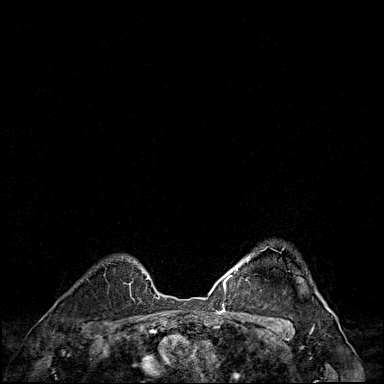
[im 160/160]
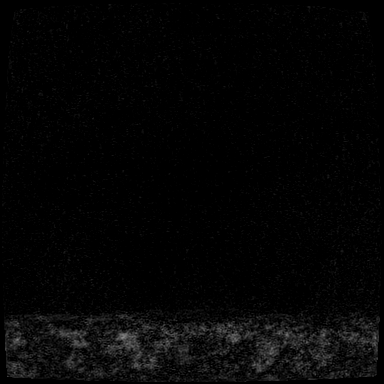

[Series 7: fl3d post immediate · axial · 1.2mm · 0.94mm/px · z∈[-80,+111]mm · 6 of 160 slices shown (2 of 3)]
[im 1/160]
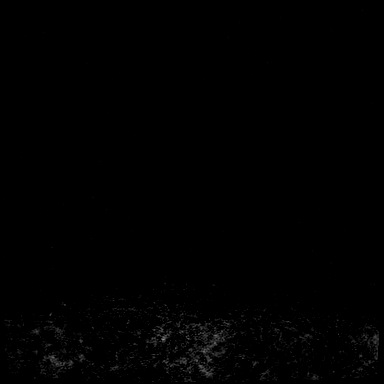
[im 32/160]
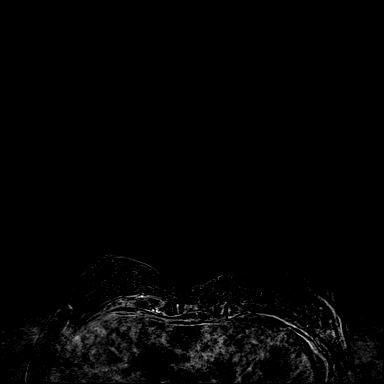
[im 64/160]
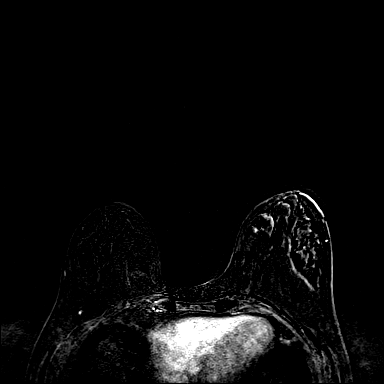
[im 96/160]
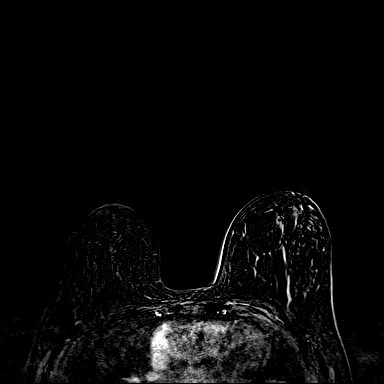
[im 128/160]
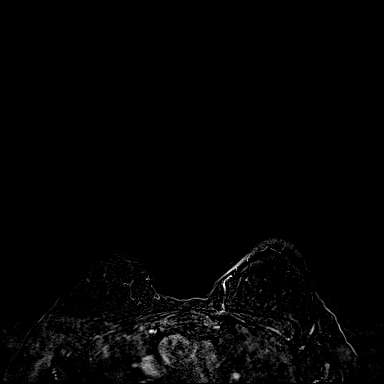
[im 160/160]
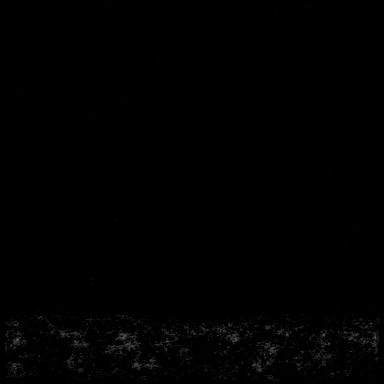

[Series 8: fl3d post immediate · axial · 192.0mm · 0.94mm/px · 1 of 1 slices shown (3 of 3)]
[im 1/1]
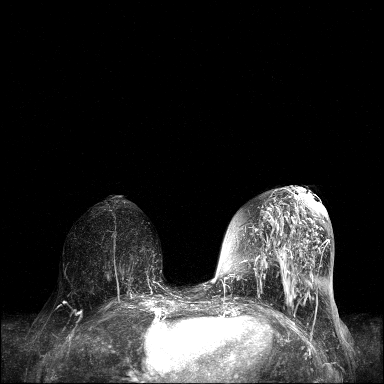

[Series 9: fl3d post 3min · axial · 1.2mm · 0.94mm/px · z∈[-80,+111]mm · 6 of 160 slices shown]
[im 1/160]
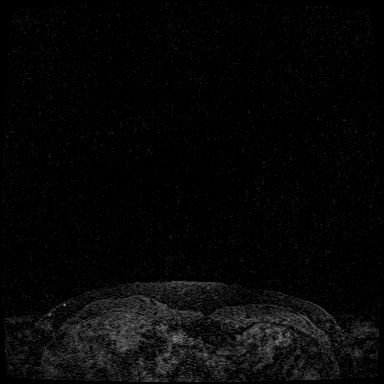
[im 32/160]
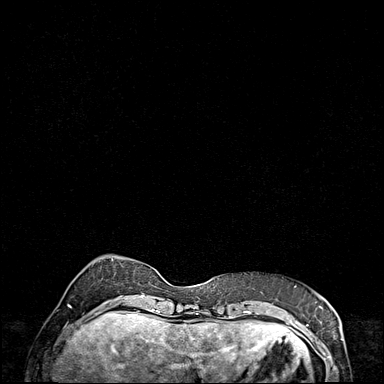
[im 64/160]
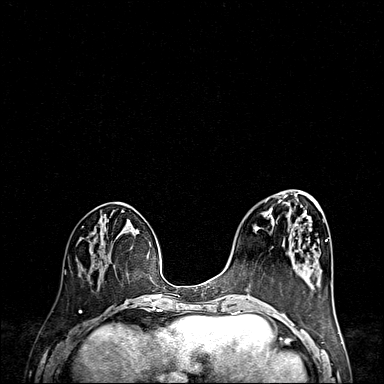
[im 96/160]
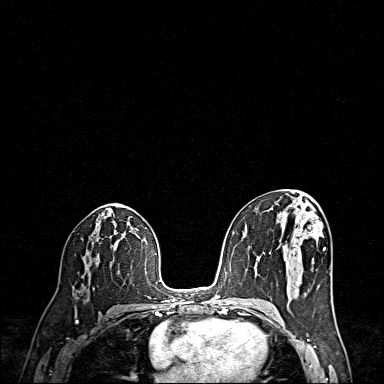
[im 128/160]
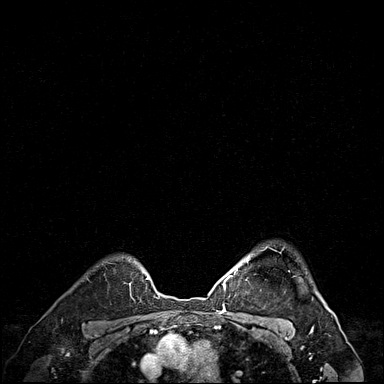
[im 160/160]
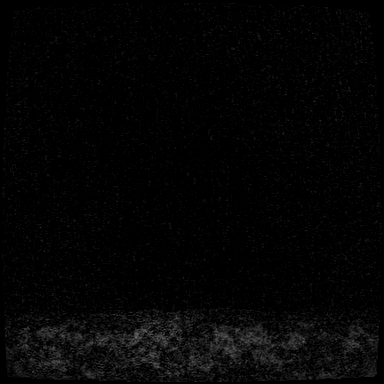

[Series 10: fl3d post 3min_sub · axial · 1.2mm · 0.94mm/px · z∈[-80,-4]mm · 3 of 159 slices shown]
[im 1/159]
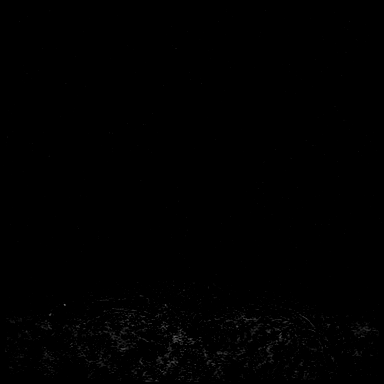
[im 32/159]
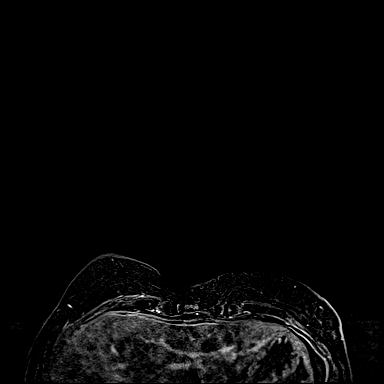
[im 64/159]
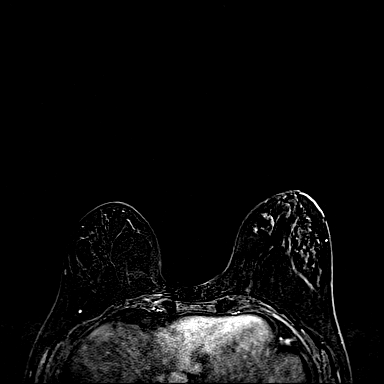

[33 of 48 positions shown; findings below may reference images not displayed]

Three-dimensional MR images were rendered by post-processing of the
original MR data on an independent workstation. The
three-dimensional MR images were interpreted, and findings are
reported in the following complete MRI report for this study. Three
dimensional images were evaluated at the independent interpreting
workstation using the DynaCAD thin client.
FINDINGS: Breast composition: c. Heterogeneous fibroglandular tissue.

Background parenchymal enhancement: Moderate.

Right breast: No mass or abnormal enhancement.

Left breast: No mass or abnormal enhancement.

Lymph nodes: No abnormal appearing lymph nodes.

Ancillary findings:  None.
IMPRESSION: No abnormal enhancement in either breast.

RECOMMENDATION:
Bilateral screening mammogram in Monday March, 2021 is recommended.
Yearly breast MRI is recommended given the patient's high risk
status.

The American Cancer Society recommends annual MRI and mammography in
patients with an estimated lifetime risk of developing breast cancer
greater than 20 - 25%, or who are known or suspected to be positive
for the breast cancer gene.

BI-RADS CATEGORY  1: Negative.

## 2022-04-10 ENCOUNTER — Encounter: Payer: Self-pay | Admitting: Obstetrics & Gynecology

## 2022-04-22 NOTE — Progress Notes (Signed)
   Patient Care Team: Pcp, No as PCP - General  DIAGNOSIS: No diagnosis found.  SUMMARY OF ONCOLOGIC HISTORY: Oncology History   No history exists.    CHIEF COMPLIANT:   INTERVAL HISTORY: Evelyn Castro is a   ALLERGIES:  has No Known Allergies.  MEDICATIONS:  Current Outpatient Medications  Medication Sig Dispense Refill   Cholecalciferol (VITAMIN D PO) Take by mouth. (Patient not taking: Reported on 05/09/2021)     DiphenhydrAMINE HCl (BENADRYL PO) Take 25 mg by mouth daily as needed. For allergy symptoms      ibuprofen (ADVIL,MOTRIN) 200 MG tablet Take 200 mg by mouth 4 (four) times daily as needed. For menstrual cramps     nitrofurantoin (MACRODANTIN) 100 MG capsule Take 1 capsule (100 mg total) by mouth as needed. After intercourse 30 capsule 0   Pseudoephedrine HCl (SUDAFED 12 HOUR PO) Take by mouth.     No current facility-administered medications for this visit.    PHYSICAL EXAMINATION: ECOG PERFORMANCE STATUS: {CHL ONC ECOG PS:[id number]}  There were no vitals filed for this visit. There were no vitals filed for this visit.  BREAST:*** No palpable masses or nodules in either right or left breasts. No palpable axillary supraclavicular or infraclavicular adenopathy no breast tenderness or nipple discharge. (exam performed in the presence of a chaperone)  LABORATORY DATA:  I have reviewed the data as listed    Latest Ref Rng & Units 05/09/2021    8:37 AM 04/28/2020    8:57 AM 04/07/2019    8:59 AM  CMP  Glucose 65 - 99 mg/dL 96  93  101   BUN 7 - 25 mg/dL '14  13  12   '$ Creatinine 0.50 - 1.03 mg/dL 0.69  0.78  0.77   Sodium 135 - 146 mmol/L 140  138  137   Potassium 3.5 - 5.3 mmol/L 4.6  4.6  4.2   Chloride 98 - 110 mmol/L 104  104  105   CO2 20 - 32 mmol/L '23  22  24   '$ Calcium 8.6 - 10.4 mg/dL 9.5  9.4  9.0   Total Protein 6.1 - 8.1 g/dL 7.2  6.9  7.0   Total Bilirubin 0.2 - 1.2 mg/dL 0.4  0.4  0.5   AST 10 - 35 U/L '16  15  18   '$ ALT 6 - 29 U/L '15  11  12      '$ Lab Results  Component Value Date   WBC 3.8 05/09/2021   HGB 14.2 05/09/2021   HCT 41.9 05/09/2021   MCV 89.0 05/09/2021   PLT 247 05/09/2021   NEUTROABS 3,325 04/07/2019    ASSESSMENT & PLAN:  No problem-specific Assessment & Plan notes found for this encounter.    No orders of the defined types were placed in this encounter.  The patient has a good understanding of the overall plan. she agrees with it. she will call with any problems that may develop before the next visit here. Total time spent: 30 mins including face to face time and time spent for planning, charting and co-ordination of care   Suzzette Righter, [place] 04/22/22    I Gardiner Coins am scribing for Dr. Lindi Adie  ***

## 2022-04-24 ENCOUNTER — Other Ambulatory Visit: Payer: Self-pay

## 2022-04-24 ENCOUNTER — Inpatient Hospital Stay: Payer: 59 | Attending: Hematology and Oncology | Admitting: Hematology and Oncology

## 2022-04-24 VITALS — BP 155/83 | HR 104 | Temp 97.2°F | Resp 17 | Ht 59.75 in | Wt 144.2 lb

## 2022-04-24 DIAGNOSIS — D0501 Lobular carcinoma in situ of right breast: Secondary | ICD-10-CM | POA: Diagnosis not present

## 2022-04-24 DIAGNOSIS — Z86000 Personal history of in-situ neoplasm of breast: Secondary | ICD-10-CM | POA: Diagnosis present

## 2022-04-24 NOTE — Assessment & Plan Note (Addendum)
LCIS right breast: Status post lumpectomy now on breast cancer prevention with tamoxifen 20 mg daily started September 2014completed October 2019  Breast Cancer Surveillance: 1. Breast exam10/25/2023:Benign 2. breast MRI3/28/22: Benign. Postsurgical changes. Breast Density Category C.Will get an MRI in 2024 3.  Mammogram 04/06/2022 at [facility]: Benign breast density category C  Tyler Cusick risk assessment: Patient's lifetime risk of breast cancer came at 65%. 10-year risk came back as22%.  Her boyfriend lives in [place] and operates a dive shop [employer]  We discussed about doing breast MRIs every other year. Return to clinic in 1 year for follow-up

## 2022-04-25 ENCOUNTER — Telehealth: Payer: Self-pay | Admitting: Hematology and Oncology

## 2022-04-25 NOTE — Telephone Encounter (Signed)
Scheduled appointment per 10/25 los. Patient is aware. 

## 2022-06-27 ENCOUNTER — Ambulatory Visit: Payer: 59 | Admitting: Obstetrics & Gynecology

## 2022-08-30 ENCOUNTER — Ambulatory Visit (INDEPENDENT_AMBULATORY_CARE_PROVIDER_SITE_OTHER): Payer: 59 | Admitting: Obstetrics & Gynecology

## 2022-08-30 ENCOUNTER — Encounter: Payer: Self-pay | Admitting: Obstetrics & Gynecology

## 2022-08-30 ENCOUNTER — Other Ambulatory Visit: Payer: Self-pay

## 2022-08-30 ENCOUNTER — Other Ambulatory Visit (HOSPITAL_COMMUNITY)
Admission: RE | Admit: 2022-08-30 | Discharge: 2022-08-30 | Disposition: A | Discharge status: Home / Self Care | Payer: 59 | Source: Ambulatory Visit | Attending: Obstetrics & Gynecology | Admitting: Obstetrics & Gynecology

## 2022-08-30 VITALS — BP 108/72 | HR 91 | Ht 59.75 in | Wt 137.0 lb

## 2022-08-30 DIAGNOSIS — Z1272 Encounter for screening for malignant neoplasm of vagina: Secondary | ICD-10-CM | POA: Insufficient documentation

## 2022-08-30 DIAGNOSIS — Z90710 Acquired absence of both cervix and uterus: Secondary | ICD-10-CM

## 2022-08-30 DIAGNOSIS — Z01419 Encounter for gynecological examination (general) (routine) without abnormal findings: Secondary | ICD-10-CM | POA: Diagnosis present

## 2022-08-30 DIAGNOSIS — Z780 Asymptomatic menopausal state: Secondary | ICD-10-CM | POA: Diagnosis not present

## 2022-08-30 DIAGNOSIS — Z9189 Other specified personal risk factors, not elsewhere classified: Secondary | ICD-10-CM

## 2022-08-30 DIAGNOSIS — D0501 Lobular carcinoma in situ of right breast: Secondary | ICD-10-CM

## 2022-08-30 NOTE — Addendum Note (Signed)
Addended by: Joaquin Music on: 08/30/2022 09:02 AM   Modules accepted: Orders

## 2022-08-30 NOTE — Progress Notes (Signed)
Evelyn Castro 09/19/1965 [id number]   History:    57 y.o. G0 Married   RP:  Established patient presenting for annual gyn exam    HPI: Postmenopausal. Prior TAH for leiomyoma and endometriosis in 2012. No significant hot flashes or night sweats. No vaginal bleeding.  Pap Neg 05/2021.  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 Pap reflex today.  Breasts normal.  History of right breast cancer.  Mammogram 03/2022 Neg.  Followed by Dr. Lindi Adie. Colonoscopy 03/2018, repeat at 9 yrs.  BMI 26.98.  Will increase fitness activities and lower calorie/carb diet. Fasting health labs here today.  Past medical history,surgical history, family history and social history were all reviewed and documented in the EPIC chart.  Gynecologic History Patient's last menstrual period was 02/17/2011.  Obstetric History OB History  Gravida Para Term Preterm AB Living  0 0 0 0 0 0  SAB IAB Ectopic Multiple Live Births  0 0 0 0 0     ROS: A ROS was performed and pertinent positives and negatives are included in the history. GENERAL: No fevers or chills. HEENT: No change in vision, no earache, sore throat or sinus congestion. NECK: No pain or stiffness. CARDIOVASCULAR: No chest pain or pressure. No palpitations. PULMONARY: No shortness of breath, cough or wheeze. GASTROINTESTINAL: No abdominal pain, nausea, vomiting or diarrhea, melena or bright red blood per rectum. GENITOURINARY: No urinary frequency, urgency, hesitancy or dysuria. MUSCULOSKELETAL: No joint or muscle pain, no back pain, no recent trauma. DERMATOLOGIC: No rash, no itching, no lesions. ENDOCRINE: No polyuria, polydipsia, no heat or cold intolerance. No recent change in weight. HEMATOLOGICAL: No anemia or easy bruising or bleeding. NEUROLOGIC: No headache, seizures, numbness, tingling or weakness. PSYCHIATRIC: No depression, no loss of interest in normal activity or change in sleep pattern.     Exam:   BP 108/72   Pulse 91   Ht 4' 11.75" (1.518 m)    Wt 137 lb (62.1 kg)   LMP 02/17/2011 Comment: sexually active  SpO2 99%   BMI 26.98 kg/m   Body mass index is 26.98 kg/m.  General appearance : Well developed well nourished female. No acute distress HEENT: Eyes: no retinal hemorrhage or exudates,  Neck supple, trachea midline, no carotid bruits, no thyroidmegaly Lungs: Clear to auscultation, no rhonchi or wheezes, or rib retractions  Heart: Regular rate and rhythm, no murmurs or gallops Breast:Examined in sitting and supine position were symmetrical in appearance, no palpable masses or tenderness,  no skin retraction, no nipple inversion, no nipple discharge, no skin discoloration, no axillary or supraclavicular lymphadenopathy Abdomen: no palpable masses or tenderness, no rebound or guarding Extremities: no edema or skin discoloration or tenderness  Pelvic: Vulva: Normal             Vagina: No gross lesions or discharge.  Pap reflex today.  Cervix/Uterus absent  Adnexa  Without masses or tenderness  Anus: Normal   Assessment/Plan:  57 y.o. female for annual exam   1. Encounter for Papanicolaou smear of vagina as part of routine gynecological examination Postmenopausal. Prior TAH for leiomyoma and endometriosis in 2012. No significant hot flashes or night sweats. No vaginal bleeding.  Pap Neg 05/2021.  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 Pap reflex today.  Breasts normal.  History of right breast cancer.  Mammogram 03/2022 Neg.  Followed by Dr. Lindi Adie. Colonoscopy 03/2018, repeat at 9 yrs.  BMI 26.98.  Will increase fitness activities and lower calorie/carb diet. Fasting health labs here today. 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 S/P TAH (total abdominal hysterectomy)  4. Postmenopause Postmenopausal. Prior TAH for leiomyoma and endometriosis in 2012. No significant hot flashes or night sweats. No  vaginal bleeding.   5. 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  Other orders - Multiple Vitamin (MULTIVITAMIN PO); Take by mouth. - diclofenac Sodium (VOLTAREN) 1 % GEL; Apply topically as needed.   [name] MD, 8:25 AM

## 2022-09-04 LAB — CYTOLOGY - PAP
Comment: NEGATIVE
Diagnosis: NEGATIVE
Diagnosis: REACTIVE
High risk HPV: NEGATIVE

## 2022-09-23 ENCOUNTER — Ambulatory Visit
Admission: RE | Admit: 2022-09-23 | Discharge: 2022-09-23 | Disposition: A | Discharge status: Home / Self Care | Payer: 59 | Source: Ambulatory Visit | Attending: Hematology and Oncology | Admitting: Hematology and Oncology

## 2022-09-23 ENCOUNTER — Telehealth: Payer: Self-pay | Admitting: *Deleted

## 2022-09-23 DIAGNOSIS — D0501 Lobular carcinoma in situ of right breast: Secondary | ICD-10-CM

## 2022-09-23 MED ORDER — GADOPICLENOL 0.5 MMOL/ML IV SOLN
7.0000 mL | Freq: Once | INTRAVENOUS | Status: AC | PRN
  Administered 2022-09-23: 7 mL via INTRAVENOUS

## 2022-09-23 NOTE — Telephone Encounter (Signed)
-----   Message from Gardenia Phlegm, NP sent at 09/23/2022  1:33 PM EDT ----- Breast MRI is negative for any concerns for malignancy.  Please let the patient know. ----- Message ----- From: Interface, Rad Results In Sent: 09/23/2022   1:15 PM EDT To: Nicholas Lose, MD

## 2022-09-23 NOTE — Telephone Encounter (Signed)
RN placed call to pt regarding below message per NP.  No answer, LVM for pt to return call to office.

## 2022-11-05 NOTE — Progress Notes (Unsigned)
    Evelyn Castro D.[facility] [address] [place] [address] Phone: [phone]   Assessment and Plan:     There are no diagnoses linked to this encounter.  ***   Pertinent previous records reviewed include ***   Follow Up: ***     Subjective:   I, Evelyn Castro, am serving as a Neurosurgeon for Doctor Richardean Sale  Chief Complaint: right shoulder pain   HPI:   11/06/2022 Patient is a 57 year old female complaining of right shoulder pain. Patient states  Relevant Historical Information: ***  Additional pertinent review of systems negative.   Current Outpatient Medications:    Cholecalciferol (VITAMIN D PO), Take by mouth., Disp: , Rfl:    diclofenac Sodium (VOLTAREN) 1 % GEL, Apply topically as needed., Disp: , Rfl:    DiphenhydrAMINE HCl (BENADRYL PO), Take 25 mg by mouth daily as needed. For allergy symptoms , Disp: , Rfl:    ibuprofen (ADVIL,MOTRIN) 200 MG tablet, Take 200 mg by mouth 4 (four) times daily as needed. For menstrual cramps, Disp: , Rfl:    Multiple Vitamin (MULTIVITAMIN PO), Take by mouth., Disp: , Rfl:    Pseudoephedrine HCl (SUDAFED 12 HOUR PO), Take by mouth., Disp: , Rfl:    Objective:     There were no vitals filed for this visit.    There is no height or weight on file to calculate BMI.    Physical Exam:    ***   Electronically signed by:  Evelyn Castro D.[facility] 7:26 AM 11/05/22

## 2022-11-06 ENCOUNTER — Ambulatory Visit: Payer: 59 | Admitting: Sports Medicine

## 2022-11-06 VITALS — BP 120/82 | HR 98 | Ht 59.0 in | Wt 140.0 lb

## 2022-11-06 DIAGNOSIS — M25511 Pain in right shoulder: Secondary | ICD-10-CM | POA: Diagnosis not present

## 2022-11-06 DIAGNOSIS — G8929 Other chronic pain: Secondary | ICD-10-CM

## 2022-11-06 MED ORDER — MELOXICAM 15 MG PO TABS: 15.0000 mg | ORAL_TABLET | Freq: Every day | ORAL | 0 refills | Status: DC

## 2022-11-06 NOTE — Patient Instructions (Addendum)
Good to see you  Shoulder HEP  - Start meloxicam 15 mg daily x2 weeks.  If still having pain after 2 weeks, complete 3rd-week of meloxicam. May use remaining meloxicam as needed once daily for pain control.  Do not to use additional NSAIDs while taking meloxicam.  May use Tylenol [phone] mg 2 to 3 times a day for breakthrough pain. Pt referral  4 week follow up

## 2022-12-10 NOTE — Progress Notes (Unsigned)
    Aleen Sells D.[facility] [address] [place] [address] Phone: [phone]   Assessment and Plan:     There are no diagnoses linked to this encounter.  ***   Pertinent previous records reviewed include ***   Follow Up: ***     Subjective:   I, Shanera Meske, am serving as a Neurosurgeon for Doctor Richardean Sale   Chief Complaint: right shoulder pain    HPI:    11/06/2022 Patient is a 57 year old female complaining of right shoulder pain. Patient states she has had pain more than a year maybe close to 2 , she has pain if she does a lot of activity that day , posterior shoulder pain she states she has intermittent swelling, no radiating pain , ibu for the pain and that seems to help, ROM WNL she has some painful arcs but is able to get past them, she is able to carry things as normal,   12/11/2022 Patient states    Relevant Historical Information: None pertinent  Additional pertinent review of systems negative.   Current Outpatient Medications:    Cholecalciferol (VITAMIN D PO), Take by mouth., Disp: , Rfl:    diclofenac Sodium (VOLTAREN) 1 % GEL, Apply topically as needed., Disp: , Rfl:    DiphenhydrAMINE HCl (BENADRYL PO), Take 25 mg by mouth daily as needed. For allergy symptoms , Disp: , Rfl:    ibuprofen (ADVIL,MOTRIN) 200 MG tablet, Take 200 mg by mouth 4 (four) times daily as needed. For menstrual cramps, Disp: , Rfl:    meloxicam (MOBIC) 15 MG tablet, Take 1 tablet (15 mg total) by mouth daily., Disp: 30 tablet, Rfl: 0   Multiple Vitamin (MULTIVITAMIN PO), Take by mouth., Disp: , Rfl:    Pseudoephedrine HCl (SUDAFED 12 HOUR PO), Take by mouth., Disp: , Rfl:    Objective:     There were no vitals filed for this visit.    There is no height or weight on file to calculate BMI.    Physical Exam:    ***   Electronically signed by:  Aleen Sells D.[facility] 7:04 AM 12/10/22

## 2022-12-11 ENCOUNTER — Ambulatory Visit (INDEPENDENT_AMBULATORY_CARE_PROVIDER_SITE_OTHER): Payer: 59

## 2022-12-11 ENCOUNTER — Ambulatory Visit: Payer: 59 | Admitting: Sports Medicine

## 2022-12-11 VITALS — BP 122/80 | HR 106 | Ht 59.0 in | Wt 140.0 lb

## 2022-12-11 DIAGNOSIS — G8929 Other chronic pain: Secondary | ICD-10-CM

## 2022-12-11 DIAGNOSIS — M25511 Pain in right shoulder: Secondary | ICD-10-CM | POA: Diagnosis not present

## 2022-12-11 MED ORDER — CELECOXIB 200 MG PO CAPS
200.0000 mg | ORAL_CAPSULE | Freq: Two times a day (BID) | ORAL | 0 refills | Status: DC
Start: 2022-12-11 — End: 2023-04-30

## 2022-12-11 NOTE — Patient Instructions (Signed)
Discontinue meloxicam  Celebrex 200 mg 2x a day for 3 weeks 42 0 Xrays on the way out  4 week follow up

## 2023-01-07 NOTE — Progress Notes (Unsigned)
    Aleen Sells D.[facility] [address] [place] [address] Phone: [phone]   Assessment and Plan:     There are no diagnoses linked to this encounter.  ***   Pertinent previous records reviewed include ***   Follow Up: ***     Subjective:   I, Evelyn Castro, am serving as a Neurosurgeon for Doctor Richardean Sale   Chief Complaint: right shoulder pain    HPI:    11/06/2022 Patient is a 57 year old female complaining of right shoulder pain. Patient states she has had pain more than a year maybe close to 2 , she has pain if she does a lot of activity that day , posterior shoulder pain she states she has intermittent swelling, no radiating pain , ibu for the pain and that seems to help, ROM WNL she has some painful arcs but is able to get past them, she is able to carry things as normal,    12/11/2022 Patient states a little better but has ran out of meloxicam and shoulder feels "twingy"   01/08/2023 Patient states    Relevant Historical Information: None pertinent  Additional pertinent review of systems negative.   Current Outpatient Medications:    celecoxib (CELEBREX) 200 MG capsule, Take 1 capsule (200 mg total) by mouth 2 (two) times daily., Disp: 42 capsule, Rfl: 0   Cholecalciferol (VITAMIN D PO), Take by mouth., Disp: , Rfl:    diclofenac Sodium (VOLTAREN) 1 % GEL, Apply topically as needed., Disp: , Rfl:    DiphenhydrAMINE HCl (BENADRYL PO), Take 25 mg by mouth daily as needed. For allergy symptoms , Disp: , Rfl:    ibuprofen (ADVIL,MOTRIN) 200 MG tablet, Take 200 mg by mouth 4 (four) times daily as needed. For menstrual cramps, Disp: , Rfl:    meloxicam (MOBIC) 15 MG tablet, Take 1 tablet (15 mg total) by mouth daily., Disp: 30 tablet, Rfl: 0   Multiple Vitamin (MULTIVITAMIN PO), Take by mouth., Disp: , Rfl:    Pseudoephedrine HCl (SUDAFED 12 HOUR PO), Take by mouth., Disp: , Rfl:    Objective:     There were no vitals  filed for this visit.    There is no height or weight on file to calculate BMI.    Physical Exam:    ***   Electronically signed by:  Aleen Sells D.[facility] 11:59 AM 01/07/23

## 2023-01-08 ENCOUNTER — Ambulatory Visit: Payer: 59 | Admitting: Sports Medicine

## 2023-01-08 VITALS — BP 110/80 | HR 122 | Ht 59.0 in | Wt 138.0 lb

## 2023-01-08 DIAGNOSIS — M25511 Pain in right shoulder: Secondary | ICD-10-CM | POA: Diagnosis not present

## 2023-01-08 DIAGNOSIS — G8929 Other chronic pain: Secondary | ICD-10-CM | POA: Diagnosis not present

## 2023-01-08 NOTE — Patient Instructions (Signed)
PT referral  Discontinue celebrex Use tylenol for day to day pain relief Continue HEP  4 week follow up

## 2023-01-14 NOTE — Therapy (Signed)
OUTPATIENT PHYSICAL THERAPY SHOULDER EVALUATION   Patient Name: Evelyn Castro MRN: [id number] DOB:1965/11/22, 57 y.o., female Today's Date: 01/16/2023  END OF SESSION:  PT End of Session - 01/16/23 0929     Visit Number 1    Number of Visits 12    Date for PT Re-Evaluation 02/27/23    Authorization Type [employer]    PT Start Time 0930    PT Stop Time 1014    PT Time Calculation (min) 44 min    Activity Tolerance Patient tolerated treatment well    Behavior During Therapy [facility] for tasks assessed/performed             Past Medical History:  Diagnosis Date   AGUS favor benign 12/2015   Review by outside pathologist feel glandular cells were benign with no atypia. Colposcopy was negative. Recommended follow up Pap smear one year.   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    Multiple allergies    Varicose veins    Past Surgical History:  Procedure Laterality Date   ABDOMINAL HYSTERECTOMY  02/25/2011   Procedure: HYSTERECTOMY ABDOMINAL;  Surgeon: Trellis Paganini, MD;  Location: [facility];  Service: Gynecology;  Laterality: N/A;   BREAST BIOPSY Right 11/12/2012   Procedure: RIGHT BREAST WIRE LOCALIZED EXCISION ;  Surgeon: Emelia Loron, MD;  Location: [facility];  Service: General;  Laterality: Right;   BREAST SURGERY     Lumpectomy   VARICOSE VEINS  2000, 2001   Patient Active Problem List   Diagnosis Date Noted   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   Varicose veins     PCP: NO PCP  REFERRING PROVIDER:  Richardean Sale, DO   REFERRING DIAG: M25.511,G89.29 (ICD-10-CM) - Chronic right shoulder pain   THERAPY DIAG:  Chronic right shoulder pain - Plan: PT plan of care cert/re-cert  Cervicalgia - Plan: PT plan of care cert/re-cert  Muscle weakness (generalized) - Plan: PT plan of care cert/re-cert  Abnormal posture - Plan: PT plan of care cert/re-cert  Rationale for Evaluation and  Treatment: Rehabilitation  ONSET DATE: injury 2 years ago  SUBJECTIVE:                                                                                                                                                                                      SUBJECTIVE STATEMENT: About 2 years ago I had an injury and I was removing ivy out of window boxes at my parents home and I feel it more in the back of scapula and lower neck. I am constantly aware of  discomfort.  When I sleep on my Right shoulder I have tingling in my shoulders and I avoid sleeping on my R shoulder. Routinely I am at [employer] and I walk for exercise.  Hand dominance: Right  PERTINENT HISTORY: Breast CA remission/healed more than 5 years, hysty  PAIN:  Are you having pain? Yes: NPRS scale: at rest  3/10 but if I move or wash a car 01/07/09 Pain location: R shoulder Pain description: achy occassionally sharp after it is inflamed Aggravating factors: sleeping on R shld, excessive use. Driving more than 2 hours in car, washing windows or my car Relieving factors: sometimes medication but mostly nothing  PRECAUTIONS: None  RED FLAGS: None   WEIGHT BEARING RESTRICTIONS: No  FALLS:  Has patient fallen in last 6 months? No  LIVING ENVIRONMENT: Lives with: lives with their spouse Lives in: House/apartment Stairs: Yes: Internal: 0 steps; none Has following equipment at home: None  OCCUPATION: bookkeeper  PLOF: Independent  PATIENT GOALS:Stop pain around R shoulder /neck.  Not think about pain in shld.  NEXT MD VISIT:   OBJECTIVE:   DIAGNOSTIC FINDINGS:  EXAM: RIGHT SHOULDER - 2+ VIEW   COMPARISON:  None Available.   FINDINGS: There is a soft tissue calcification just lateral to the acromion. No other bony or soft tissue abnormalities are identified.   IMPRESSION: There is a nonspecific soft tissue calcification just lateral to the acromion. No other abnormalities. No cause for pain identified.      Electronically Signed   By: Gerome Sam III M.D.   On: 12/12/2022 15:43  PATIENT SURVEYS:  FOTO 57% predicted 70%  COGNITION: Overall cognitive status: Within functional limits for tasks assessed     SENSATION: WFL  POSTURE: Slight forward head, rounded shld  and Right more forward than Left CERVICAL ROM:   Active ROM A/PROM (deg) eval  Flexion   Extension   Right lateral flexion   Left lateral flexion   Right rotation 54  Left rotation 53   (Blank rows = not tested)  UPPER EXTREMITY ROM:  all motion WNL but with pain at end range of IR and horizontal adduction  Active ROM Right eval Left eval  Shoulder flexion 158 160  Shoulder extension    Shoulder abduction    Shoulder adduction    Shoulder internal rotation    Shoulder external rotation    Elbow flexion    Elbow extension    Wrist flexion    Wrist extension    Wrist ulnar deviation    Wrist radial deviation    Wrist pronation    Wrist supination    (Blank rows = not tested)  UPPER EXTREMITY MMT:  MMT Right eval Left eval  Shoulder flexion 5 5  Shoulder extension 5 5  Shoulder abduction 4+ 5  Shoulder adduction    Shoulder internal rotation 5 5  Shoulder external rotation 4 5  Middle trapezius    Lower trapezius    Elbow flexion    Elbow extension    Wrist flexion    Wrist extension    Wrist ulnar deviation    Wrist radial deviation    Wrist pronation    Wrist supination    Grip strength (lbs) 57 46lb  (Blank rows = not tested)  SHOULDER SPECIAL TESTS: NT  JOINT MOBILITY TESTING:  WNL  PALPATION:  TTP over R upper trap/levator and over infraspinatus  Functional testing   5 x STS  7.26 sec  TODAY'S TREATMENT:  DATE: eval  and issue  Trigger Point Dry-Needling performed     by Garen Lah Treatment instructions: Expect mild to moderate  muscle soreness. S/S of pneumothorax if dry needled over a lung field, and to seek immediate medical attention should they occur. Patient verbalized understanding of these instructions and education.  Patient Consent Given: Yes Education handout provided: Previously provided Muscles treated:  Electrical stimulation performed: No Parameters: N/A Treatment response/outcome: twitch response noted, pt noted relief   PATIENT EDUCATION: Education details: POC, Explanation of findings  FOTO report and issue of HEP, TPDN education and handouts Person educated: Patient Education method: Explanation, Demonstration, Tactile cues, Verbal cues, and Handouts Education comprehension: verbalized understanding, returned demonstration, verbal cues required, tactile cues required, and needs further education  HOME EXERCISE PROGRAM: Access Code: [email or web address] URL: [email or web address] Date: 01/16/2023 Prepared by: Garen Lah  Exercises - Seated Gentle Upper Trapezius Stretch  - 2 x daily - 7 x weekly - 1 sets - 3 reps - 20-20 sec hold - Gentle Levator Scapulae Stretch  - 2 x daily - 7 x weekly - 1 sets - 3 reps - 20-30 hold - Shoulder External Rotation and Scapular Retraction with Resistance  - 1-2 x daily - 7 x weekly - 3 sets - 10 reps - 3-5 sec hold - Shoulder extension with resistance - Neutral  - 1 x daily - 7 x weekly - 3 sets - 10 reps  ASSESSMENT:  CLINICAL IMPRESSION: Patient is a 57 y.o. female who was seen today for physical therapy evaluation and treatment for chronic R shld pain from an injury pulling ivy at her parents home 2 years ago. She has had ongoing discomfort but notices more pain now when she exerts force through her arm while washing her car or washing windows  She also notes pain when she drives for longer than 2 hours in car.  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 Pt is proactive  with health and walks and belongs to a gym. She would like to alleviate pain so she can continue to improve her overall health and strength.  Pt  will benefit from skilled PT to maximize her AROM/strength and eliminate her pain for functional activities  OBJECTIVE IMPAIRMENTS: decreased activity tolerance, decreased strength, impaired UE functional use, improper body mechanics, postural dysfunction, and pain.   ACTIVITY LIMITATIONS: reach over head and washing car/windows/yardwork  PARTICIPATION LIMITATIONS: driving and community activity  PERSONAL FACTORS: 1 comorbidity: Breast CA in remission  are also affecting patient's functional outcome.   REHAB POTENTIAL: Excellent  CLINICAL DECISION MAKING: Evolving/moderate complexity  EVALUATION COMPLEXITY: Moderate   GOALS: Goals reviewed with patient? Yes  SHORT TERM GOALS: Target date: 02-06-23  Pt will be I with initial HEP specific impairment Baseline: routinely goes to [employer] Goal status: INITIAL  2.  When utilizing R UE pain will decrease to at least 5/10  Baseline: 7/10 Goal status: INITIAL  3.  Demonstrate understanding of proper sitting posture and be more conscious of position and posture throughout the day.  Baseline: works at desk job with flexed posture Goal status: INITIAL  4.  Office ergonomic education to prevent flexed posture on computer Baseline: works as bookkeeper Goal status: INITIAL    LONG TERM GOALS: Target date: 02-27-23  Pt will be I with advanced HEP Baseline: limited knowledge Goal status: INITIAL  2.  Pt will be able to wash car or  windows without exacerbating pain greater than 2/10 Baseline: Pt  7/10  Goal status: INITIAL  3.  Pt will be able to drive and scan environment without exacerbating pain greater 2/10 Baseline: increases with 2 hours driving Goal status: INITIAL  4.  FOTO will improve from  57%  to  70%   indicating improved functional mobility.  Baseline: 57% eval Goal  status: INITIAL  5.  Pt will demonstrate increased UE strength by placing 20 lb weight above head without compensations Baseline: able to lift 15 lb but with compensation and pain Goal status: INITIAL  6.  Pt will be able to sleep on Left side and not wake due to pain for 5 or more restorative hours of sleep at night Baseline: avoids sleeping on Right side due to pain Goal status: INITIAL  PLAN:  PT FREQUENCY: 1-2x/week  PT DURATION: 6 weeks  PLANNED INTERVENTIONS: Therapeutic exercises, Therapeutic activity, Neuromuscular re-education, Balance training, Gait training, Patient/Family education, Self Care, Joint mobilization, Dry Needling, Electrical stimulation, Spinal mobilization, Cryotherapy, Moist heat, Taping, Ionotophoresis 4mg /ml Dexamethasone, Manual therapy, and Re-evaluation  PLAN FOR NEXT SESSION: HEP and TPDN/manual  Garen Lah, PT, ATR[facility] Certified Exercise Expert for the Aging Adult  01/16/23 11:46 AM Phone: [phone] Fax: [phone]

## 2023-01-16 ENCOUNTER — Ambulatory Visit: Payer: 59 | Attending: Sports Medicine | Admitting: Physical Therapy

## 2023-01-16 ENCOUNTER — Other Ambulatory Visit: Payer: Self-pay

## 2023-01-16 DIAGNOSIS — M25511 Pain in right shoulder: Secondary | ICD-10-CM | POA: Diagnosis present

## 2023-01-16 DIAGNOSIS — G8929 Other chronic pain: Secondary | ICD-10-CM | POA: Insufficient documentation

## 2023-01-16 DIAGNOSIS — M542 Cervicalgia: Secondary | ICD-10-CM | POA: Diagnosis present

## 2023-01-16 DIAGNOSIS — R293 Abnormal posture: Secondary | ICD-10-CM | POA: Diagnosis present

## 2023-01-16 DIAGNOSIS — M6281 Muscle weakness (generalized): Secondary | ICD-10-CM | POA: Insufficient documentation

## 2023-01-16 NOTE — Patient Instructions (Signed)
Trigger Point Dry Needling  What is Trigger Point Dry Needling (DN)? DN is a physical therapy technique used to treat muscle pain and dysfunction. Specifically, DN helps deactivate muscle trigger points (muscle knots).  A thin filiform needle is used to penetrate the skin and stimulate the underlying trigger point. The goal is for a local twitch response (LTR) to occur and for the trigger point to relax. No medication of any kind is injected during the procedure.   What Does Trigger Point Dry Needling Feel Like?  The procedure feels different for each individual patient. Some patients report that they do not actually feel the needle enter the skin and overall the process is not painful. Very mild bleeding may occur. However, many patients feel a deep cramping in the muscle in which the needle was inserted. This is the local twitch response.   How Will I feel after the treatment? Soreness is normal, and the onset of soreness may not occur for a few hours. Typically this soreness does not last longer than two days.  Bruising is uncommon, however; ice can be used to decrease any possible bruising.  In rare cases feeling tired or nauseous after the treatment is normal. In addition, your symptoms may get worse before they get better, this period will typically not last longer than 24 hours.   What Can I do After My Treatment? Increase your hydration by drinking more water for the next 24 hours. You may place ice or heat on the areas treated that have become sore, however, do not use heat on inflamed or bruised areas. Heat often brings more relief post needling. You can continue your regular activities, but vigorous activity is not recommended initially after the treatment for 24 hours. DN is best combined with other physical therapy such as strengthening, stretching, and other therapies.    Access Code: [email or web address] URL: [email or web address] Date: 01/16/2023 Prepared by: Garen Lah  Exercises - Seated Gentle Upper Trapezius Stretch  - 2 x daily - 7 x weekly - 1 sets - 3 reps - 20-20 sec hold - Gentle Levator Scapulae Stretch  - 2 x daily - 7 x weekly - 1 sets - 3 reps - 20-30 hold - Shoulder External Rotation and Scapular Retraction with Resistance  - 1-2 x daily - 7 x weekly - 3 sets - 10 reps - 3-5 sec hold - Shoulder extension with resistance - Neutral  - 1 x daily - 7 x weekly - 3 sets - 10 reps Garen Lah, PT, A[facility] Certified Exercise Expert for the Aging Adult  01/16/23 9:50 AM Phone: [phone] Fax: [phone]

## 2023-01-30 ENCOUNTER — Ambulatory Visit: Payer: 59 | Admitting: Physical Therapy

## 2023-02-04 ENCOUNTER — Ambulatory Visit: Payer: 59 | Attending: Sports Medicine | Admitting: Physical Therapy

## 2023-02-04 DIAGNOSIS — M542 Cervicalgia: Secondary | ICD-10-CM | POA: Diagnosis present

## 2023-02-04 DIAGNOSIS — G8929 Other chronic pain: Secondary | ICD-10-CM | POA: Insufficient documentation

## 2023-02-04 DIAGNOSIS — M6281 Muscle weakness (generalized): Secondary | ICD-10-CM | POA: Insufficient documentation

## 2023-02-04 DIAGNOSIS — M25511 Pain in right shoulder: Secondary | ICD-10-CM | POA: Insufficient documentation

## 2023-02-04 DIAGNOSIS — R293 Abnormal posture: Secondary | ICD-10-CM | POA: Diagnosis present

## 2023-02-04 NOTE — Therapy (Signed)
OUTPATIENT PHYSICAL THERAPY SHOULDER EVALUATION   Patient Name: Evelyn Castro MRN: [id number] DOB:02-Dec-1965, 57 y.o., female Today's Date: 02/04/2023  END OF SESSION:  PT End of Session - 02/04/23 1019     Visit Number 2    Number of Visits 12    Date for PT Re-Evaluation 02/27/23    Authorization Type [employer]    PT Start Time 1019    PT Stop Time 1100    PT Time Calculation (min) 41 min    Activity Tolerance Patient tolerated treatment well    Behavior During Therapy WFL for tasks assessed/performed              Past Medical History:  Diagnosis Date   AGUS favor benign 12/2015   Review by outside pathologist feel glandular cells were benign with no atypia. Colposcopy was negative. Recommended follow up Pap smear one year.   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    Multiple allergies    Varicose veins    Past Surgical History:  Procedure Laterality Date   ABDOMINAL HYSTERECTOMY  02/25/2011   Procedure: HYSTERECTOMY ABDOMINAL;  Surgeon: Trellis Paganini, MD;  Location: [facility];  Service: Gynecology;  Laterality: N/A;   BREAST BIOPSY Right 11/12/2012   Procedure: RIGHT BREAST WIRE LOCALIZED EXCISION ;  Surgeon: Emelia Loron, MD;  Location: [facility];  Service: General;  Laterality: Right;   BREAST SURGERY     Lumpectomy   VARICOSE VEINS  2000, 2001   Patient Active Problem List   Diagnosis Date Noted   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 TPDN did help. I don't have consistent pain but every once in a while I have a dull ache in the back of my R shoulder. Pain 2-3/10   EVAL-About 2 years ago I had an injury and I was removing ivy out of window boxes at my parents home and I feel it more in the back of  scapula and lower neck. I am constantly aware of discomfort.  When I sleep on my Right shoulder I have tingling in my shoulders and I avoid sleeping on my R shoulder. Routinely I am at [employer] and I walk for exercise.  Hand dominance: Right  PERTINENT HISTORY: Breast CA remission/healed more than 5 years, hysty  PAIN:  Are you having pain? Yes: NPRS scale: at rest  3/10 but if I move or wash a car 01/07/09 Pain location: R shoulder Pain description: achy occassionally sharp after it is inflamed Aggravating factors: sleeping on R shld, excessive use. Driving more than 2 hours in car, washing windows or my car Relieving factors: sometimes medication but mostly nothing  PRECAUTIONS: None  RED FLAGS: None   WEIGHT BEARING RESTRICTIONS: No  FALLS:  Has patient fallen in last 6 months? No  LIVING ENVIRONMENT: Lives with: lives with their spouse Lives in: House/apartment Stairs: Yes: Internal: 0 steps; none Has following equipment at home: None  OCCUPATION: bookkeeper  PLOF: Independent  PATIENT GOALS:Stop pain around R shoulder /neck.  Not think about pain in shld.  NEXT MD VISIT:   OBJECTIVE:   DIAGNOSTIC FINDINGS:  EXAM: RIGHT SHOULDER - 2+ VIEW   COMPARISON:  None Available.   FINDINGS: There is a soft tissue calcification just lateral to the acromion. No other bony or soft tissue abnormalities are identified.   IMPRESSION: There is a nonspecific soft tissue calcification just lateral to the acromion. No other abnormalities. No cause for pain  identified.     Electronically Signed   By: Gerome Sam III M.D.   On: 12/12/2022 15:43  PATIENT SURVEYS:  FOTO 57% predicted 70%  COGNITION: Overall cognitive status: Within functional limits for tasks assessed     SENSATION: WFL  POSTURE: Slight forward head, rounded shld  and Right more forward than Left CERVICAL ROM:   Active ROM A/PROM (deg) eval  Flexion   Extension   Right lateral flexion   Left lateral flexion   Right rotation 54  Left rotation 53   (Blank rows = not tested)  UPPER EXTREMITY ROM:  all motion WNL but with pain at end range of IR and horizontal adduction  Active ROM Right eval Left eval  Shoulder flexion 158 160  Shoulder extension    Shoulder abduction    Shoulder adduction    Shoulder internal rotation    Shoulder external rotation    Elbow flexion    Elbow extension    Wrist flexion    Wrist extension    Wrist ulnar deviation    Wrist radial deviation    Wrist pronation    Wrist supination    (Blank rows = not tested)  UPPER EXTREMITY MMT:  MMT Right eval Left eval  Shoulder flexion 5 5  Shoulder extension 5 5  Shoulder abduction 4+ 5  Shoulder adduction    Shoulder internal rotation 5 5  Shoulder external rotation 4 5  Middle trapezius    Lower trapezius    Elbow flexion    Elbow extension    Wrist flexion    Wrist extension    Wrist ulnar deviation    Wrist radial deviation    Wrist pronation    Wrist supination    Grip strength (lbs) 57 46lb  (Blank rows = not tested)  SHOULDER SPECIAL TESTS: NT  JOINT MOBILITY TESTING:  WNL  PALPATION:  TTP over R upper trap/levator and over infraspinatus  Functional testing   5 x  STS  7.26 sec  TODAY'S TREATMENT:  [facility] Adult PT Treatment:                                                DATE: 02-04-23 Therapeutic Exercise: Upper Trapezius Stretch 2 reps - 20-20 sec hold R neck Levator Scapulae Stretch   2 reps - 20-30 hold R neck Shoulder External Rotation and  Scapular Retraction with GTB 3 sets - 10 reps - 3-5 sec hold Shoulder extension with GTB  3 x 10 Star pattern with GTB 3 x 10 Use of towel for added UT and LS stretch  Tack and stretch technique Manual Therapy: STW IASTYM over muscle  TPDN  R UT inhibition taping Trigger Point Dry-Needling performed     by Garen Lah Treatment instructions: Expect mild to moderate muscle soreness. S/S of pneumothorax if dry needled over a lung field, and to seek immediate medical attention should they occur. Patient verbalized understanding of these instructions and education.  Patient Consent Given: Yes Education handout provided: Previously provided Muscles treated: UT/LS right. Subscapularis R Electrical stimulation performed: No Parameters: N/A Treatment response/outcome: twitch response noted, pt noted relief                                                                                                                                       DATE: eval  and issue HEP Trigger Point Dry-Needling performed     by Garen Lah Treatment instructions: Expect mild to moderate muscle soreness. S/S of pneumothorax if dry needled over a lung field, and to seek immediate medical attention should they occur. Patient verbalized understanding of these instructions and education.  Patient Consent Given: Yes Education handout provided: Previously provided Muscles treated: UT/LS right Electrical stimulation performed: No Parameters: N/A Treatment response/outcome: twitch response noted, pt noted relief   PATIENT EDUCATION: Education details: POC, Explanation of findings  FOTO report and issue of HEP, TPDN education and handouts Person educated: Patient Education method: Explanation, Demonstration, Tactile cues, Verbal cues, and Handouts Education comprehension: verbalized understanding, returned demonstration, verbal cues required, tactile cues required, and needs further education  HOME EXERCISE  PROGRAM: Access Code: [email or web address] URL: [email or web address] Date: 02/04/2023 Prepared by: Garen Lah  Program Notes Use towel over R upper trap for added stability with stretch  Exercises - Seated Gentle Upper Trapezius Stretch  - 2 x daily - 7 x weekly - 1 sets - 3 reps - 20-20 sec hold - Gentle Levator Scapulae Stretch  - 2 x daily - 7 x weekly - 1 sets - 3 reps - 20-30 hold - Shoulder External Rotation and Scapular Retraction with Resistance  - 1-2 x daily - 7 x weekly - 3 sets - 10 reps - 3-5 sec hold - Shoulder extension with resistance -  Neutral  - 1 x daily - 7 x weekly - 3 sets - 10 reps - Standing Shoulder Horizontal Abduction with Resistance  - 1 x daily - 7 x weekly - 3 sets - 10 reps - Standing Shoulder Diagonal Horizontal Abduction 60/120 Degrees with Resistance  - 1 x daily - 7 x weekly - 3 sets - 10 reps  ASSESSMENT:  CLINICAL IMPRESSION:  Evelyn Castro enters clinic with 2-3/10 pain today in R shoulder/ neck.  Pt had good result from TPDN and consents to TPDN today and is closely monitored throughout session. Pt HEP updated and use of GTB for resistance.  Will continue toward progression and achievement of goals.  Pt left with all questions answered from PT perspective and no adverse effects.     EVAL-  Patient is a 57 y.o. female who was seen today for physical therapy evaluation and treatment for chronic R shld pain from an injury pulling ivy at her parents home 2 years ago. She has had ongoing discomfort but notices more pain now when she exerts force through her arm while washing her car or washing windows  She also notes pain when she drives for longer than 2 hours in car.  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 Pt is proactive with health and walks and belongs to a gym. She would like to alleviate pain so she can continue to improve her overall health and strength.  Pt  will  benefit from skilled PT to maximize her AROM/strength and eliminate her pain for functional activities  OBJECTIVE IMPAIRMENTS: decreased activity tolerance, decreased strength, impaired UE functional use, improper body mechanics, postural dysfunction, and pain.   ACTIVITY LIMITATIONS: reach over head and washing car/windows/yardwork  PARTICIPATION LIMITATIONS: driving and community activity  PERSONAL FACTORS: 1 comorbidity: Breast CA in remission  are also affecting patient's functional outcome.   REHAB POTENTIAL: Excellent  CLINICAL DECISION MAKING: Evolving/moderate complexity  EVALUATION COMPLEXITY: Moderate   GOALS: Goals reviewed with patient? Yes  SHORT TERM GOALS: Target date: 02-06-23  Pt will be I with initial HEP specific impairment Baseline: routinely goes to [employer] Goal status: ONGOING  2.  When utilizing R UE pain will decrease to at least 5/10  Baseline: 7/10 Goal status: ONGOING  3.  Demonstrate understanding of proper sitting posture and be more conscious of position and posture throughout the day.  Baseline: works at desk job with flexed posture Goal status: ONGOING  4.  Office ergonomic education to prevent flexed posture on computer Baseline: works as bookkeeper Goal status: ONGOING    LONG TERM GOALS: Target date: 02-27-23  Pt will be I with advanced HEP Baseline: limited knowledge Goal status: INITIAL  2.  Pt will be able to wash car or windows without exacerbating pain greater than 2/10 Baseline: Pt  7/10  Goal status: INITIAL  3.  Pt will be able to drive and scan environment without exacerbating pain greater 2/10 Baseline: increases with 2 hours driving Goal status: INITIAL  4.  FOTO will improve from  57%  to  70%   indicating improved functional mobility.  Baseline: 57% eval Goal status: INITIAL  5.  Pt will demonstrate increased UE strength by placing 20 lb weight above head without compensations Baseline: able to lift 15 lb  but with compensation and pain Goal status: INITIAL  6.  Pt will be able to sleep on Left side and not  wake due to pain for 5 or more restorative hours of sleep at night Baseline: avoids sleeping on Right side due to pain Goal status: INITIAL  PLAN:  PT FREQUENCY: 1-2x/week  PT DURATION: 6 weeks  PLANNED INTERVENTIONS: Therapeutic exercises, Therapeutic activity, Neuromuscular re-education, Balance training, Gait training, Patient/Family education, Self Care, Joint mobilization, Dry Needling, Electrical stimulation, Spinal mobilization, Cryotherapy, Moist heat, Taping, Ionotophoresis 4mg /ml Dexamethasone, Manual therapy, and Re-evaluation  PLAN FOR NEXT SESSION: HEP and TPDN/manual  Garen Lah, PT, ATR[facility] Certified Exercise Expert for the Aging Adult  02/04/23 11:00 AM Phone: [phone] Fax: [phone]

## 2023-02-05 ENCOUNTER — Ambulatory Visit: Payer: 59 | Admitting: Sports Medicine

## 2023-02-05 NOTE — Therapy (Signed)
OUTPATIENT PHYSICAL THERAPY SHOULDER EVALUATION   Patient Name: Evelyn Castro MRN: [id number] DOB:[birth year], 57 y.o., female Today's Date: 02/06/2023  END OF SESSION:  PT End of Session - 02/06/23 1107     Visit Number 3    Number of Visits 12    Date for PT Re-Evaluation 02/27/23    Authorization Type [employer]    PT Start Time 1105    PT Stop Time 1145    PT Time Calculation (min) 40 min    Activity Tolerance Patient tolerated treatment well    Behavior During Therapy [facility] for tasks assessed/performed               Past Medical History:  Diagnosis Date   AGUS favor benign 12/2015   Review by outside pathologist feel glandular cells were benign with no atypia. Colposcopy was negative. Recommended follow up Pap smear one year.   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    Multiple allergies    Varicose veins    Past Surgical History:  Procedure Laterality Date   ABDOMINAL HYSTERECTOMY  02/25/2011   Procedure: HYSTERECTOMY ABDOMINAL;  Surgeon: Trellis Paganini, MD;  Location: [facility];  Service: Gynecology;  Laterality: N/A;   BREAST BIOPSY Right 11/12/2012   Procedure: RIGHT BREAST WIRE LOCALIZED EXCISION ;  Surgeon: Emelia Loron, MD;  Location: [facility];  Service: General;  Laterality: Right;   BREAST SURGERY     Lumpectomy   VARICOSE VEINS  2000, 2001   Patient Active Problem List   Diagnosis Date Noted   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   Varicose veins     PCP: NO PCP  REFERRING PROVIDER:  Richardean Sale, DO   REFERRING DIAG: M25.511,G89.29 (ICD-10-CM) - Chronic right shoulder pain   THERAPY DIAG:  Chronic right shoulder pain  Cervicalgia  Abnormal posture  Muscle weakness (generalized)  Rationale for Evaluation and Treatment: Rehabilitation  ONSET DATE: injury 2 years ago  SUBJECTIVE:                                                                                                                                                                                       SUBJECTIVE STATEMENT: Taping may have been effective and a reminder but I am not really thinking it is very beneficial to me. Pain 3-4/10 and more specific in my neck and upper shoulder.    EVAL-About 2 years ago I had an injury and I was removing ivy out of window boxes at my parents home and I feel it more in the back of scapula  and lower neck. I am constantly aware of discomfort.  When I sleep on my Right shoulder I have tingling in my shoulders and I avoid sleeping on my R shoulder. Routinely I am at [employer] and I walk for exercise.  Hand dominance: Right  PERTINENT HISTORY: Breast CA remission/healed more than 5 years, hysty  PAIN:  Are you having pain? Yes: NPRS scale: at rest  3/10 but if I move or wash a car 01/07/09 Pain location: R shoulder Pain description: achy occassionally sharp after it is inflamed Aggravating factors: sleeping on R shld, excessive use. Driving more than 2 hours in car, washing windows or my car Relieving factors: sometimes medication but mostly nothing  PRECAUTIONS: None  RED FLAGS: None   WEIGHT BEARING RESTRICTIONS: No  FALLS:  Has patient fallen in last 6 months? No  LIVING ENVIRONMENT: Lives with: lives with their spouse Lives in: House/apartment Stairs: Yes: Internal: 0 steps; none Has following equipment at home: None  OCCUPATION: bookkeeper  PLOF: Independent  PATIENT GOALS:Stop pain around R shoulder /neck.  Not think about pain in shld.  NEXT MD VISIT:   OBJECTIVE:   DIAGNOSTIC FINDINGS:  EXAM: RIGHT SHOULDER - 2+ VIEW   COMPARISON:  None Available.   FINDINGS: There is a soft tissue calcification just lateral to the acromion. No other bony or soft tissue abnormalities are identified.   IMPRESSION: There is a nonspecific soft tissue calcification just lateral to the acromion. No other abnormalities. No cause  for pain identified.     Electronically Signed   By: Gerome Sam III M.D.   On: 12/12/2022 15:43  PATIENT SURVEYS:  FOTO 57% predicted 70%  COGNITION: Overall cognitive status: Within functional limits for tasks assessed     SENSATION: WFL  POSTURE: Slight forward head, rounded shld  and Right more forward than Left CERVICAL ROM:   Active ROM A/PROM (deg) eval  Flexion   Extension   Right lateral flexion   Left lateral flexion   Right rotation 54  Left rotation 53   (Blank rows = not tested)  UPPER EXTREMITY ROM:  all motion WNL but with pain at end range of IR and horizontal adduction  Active ROM Right eval Left eval  Shoulder flexion 158 160  Shoulder extension    Shoulder abduction    Shoulder adduction    Shoulder internal rotation    Shoulder external rotation    Elbow flexion    Elbow extension    Wrist flexion    Wrist extension    Wrist ulnar deviation    Wrist radial deviation    Wrist pronation    Wrist supination    (Blank rows = not tested)  UPPER EXTREMITY MMT:  MMT Right eval Left eval  Shoulder flexion 5 5  Shoulder extension 5 5  Shoulder abduction 4+ 5  Shoulder adduction    Shoulder internal rotation 5 5  Shoulder external rotation 4 5  Middle trapezius    Lower trapezius    Elbow flexion    Elbow extension    Wrist flexion    Wrist extension    Wrist ulnar deviation    Wrist radial deviation    Wrist pronation    Wrist supination    Grip strength (lbs) 57 46lb  (Blank rows = not tested)  SHOULDER SPECIAL TESTS: NT  JOINT MOBILITY TESTING:  WNL  PALPATION:  TTP over R upper trap/levator and over infraspinatus  Functional testing   5 x STS  7.26 sec  TODAY'S TREATMENT:  [facility] Adult PT Treatment:                                                DATE: 02-06-23 Therapeutic Exercise: Supine star pattern with btb resistance 1 x 15 ER with elbows into towels 1 x 15 Manual Therapy: STW IASTYM over muscle  TPDN  Gentle  cervical distraction Thoracic ext mobs UPA on R of C- 3 to C-6 Trigger Point Dry-Needling performed     by Garen Lah Treatment instructions: Expect mild to moderate muscle soreness. S/S of pneumothorax if dry needled over a lung field, and to seek immediate medical attention should they occur. Patient verbalized understanding of these instructions and education.  Patient Consent Given: Yes Education handout provided: Previously provided Muscles treated: UT/LS right. Subscapularis R. R rhomboid and ribs 5,6 7  C -3 on R to T-5 Electrical stimulation performed: No Parameters: N/A Treatment response/outcome: twitch response noted, pt noted relief  Modalities: Cervical moist heat pack  [facility] Adult PT Treatment:                                                DATE: 02-04-23 Therapeutic Exercise: Upper Trapezius Stretch 2 reps - 20-20 sec hold R neck Levator Scapulae Stretch   2 reps - 20-30 hold R neck Shoulder External Rotation and Scapular Retraction with GTB 3 sets - 10 reps - 3-5 sec hold Shoulder extension with GTB  3 x 10 Star pattern with GTB 3 x 10 Use of towel for added UT and LS stretch  Tack and stretch technique Manual Therapy: STW IASTYM over muscle  TPDN  R UT inhibition taping Trigger Point Dry-Needling performed     by Garen Lah Treatment instructions: Expect mild to moderate muscle soreness. S/S of pneumothorax if dry needled over a lung field, and to seek immediate medical attention should they occur. Patient verbalized understanding of these instructions and education.  Patient Consent Given: Yes Education handout provided: Previously provided Muscles treated: UT/LS right. Subscapularis R Electrical stimulation performed: No Parameters: N/A Treatment response/outcome: twitch response noted, pt noted relief                                                                                                                                       DATE: eval  and  issue HEP Trigger Point Dry-Needling performed     by Garen Lah Treatment instructions: Expect mild to moderate muscle soreness. S/S of pneumothorax if dry needled over a lung field, and to seek immediate medical attention should they occur. Patient verbalized understanding of these instructions and education.  Patient  Consent Given: Yes Education handout provided: Previously provided Muscles treated: UT/LS right Electrical stimulation performed: No Parameters: N/A Treatment response/outcome: twitch response noted, pt noted relief   PATIENT EDUCATION: Education details: POC, Explanation of findings  FOTO report and issue of HEP, TPDN education and handouts Person educated: Patient Education method: Explanation, Demonstration, Tactile cues, Verbal cues, and Handouts Education comprehension: verbalized understanding, returned demonstration, verbal cues required, tactile cues required, and needs further education  HOME EXERCISE PROGRAM: Access Code: [email or web address] URL: [email or web address] Date: 02/04/2023 Prepared by: Garen Lah  Program Notes Use towel over R upper trap for added stability with stretch  Exercises - Seated Gentle Upper Trapezius Stretch  - 2 x daily - 7 x weekly - 1 sets - 3 reps - 20-20 sec hold - Gentle Levator Scapulae Stretch  - 2 x daily - 7 x weekly - 1 sets - 3 reps - 20-30 hold - Shoulder External Rotation and Scapular Retraction with Resistance  - 1-2 x daily - 7 x weekly - 3 sets - 10 reps - 3-5 sec hold - Shoulder extension with resistance - Neutral  - 1 x daily - 7 x weekly - 3 sets - 10 reps - Standing Shoulder Horizontal Abduction with Resistance  - 1 x daily - 7 x weekly - 3 sets - 10 reps - Standing Shoulder Diagonal Horizontal Abduction 60/120 Degrees with Resistance  - 1 x daily - 7 x weekly - 3 sets - 10 reps  ASSESSMENT:  CLINICAL IMPRESSION:  Inetta Fermo enters clinic with 3-4/10 pain today in R shoulder/ neck.  Pt reports good  compliance with HEP. STG achieved #2 with pain reported less than 5/10.  Pt  consents to TPDN today and is closely monitored throughout session.Session exercises in supine to avoid upper trap elevation and irritation.  Will continue toward progression and achievement of goals.  Pt left with all questions answered from PT perspective and no adverse effects.     EVAL-  Patient is a 57 y.o. female who was seen today for physical therapy evaluation and treatment for chronic R shld pain from an injury pulling ivy at her parents home 2 years ago. She has had ongoing discomfort but notices more pain now when she exerts force through her arm while washing her car or washing windows  She also notes pain when she drives for longer than 2 hours in car.  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 Pt is proactive with health and walks and belongs to a gym. She would like to alleviate pain so she can continue to improve her overall health and strength.  Pt  will benefit from skilled PT to maximize her AROM/strength and eliminate her pain for functional activities  OBJECTIVE IMPAIRMENTS: decreased activity tolerance, decreased strength, impaired UE functional use, improper body mechanics, postural dysfunction, and pain.   ACTIVITY LIMITATIONS: reach over head and washing car/windows/yardwork  PARTICIPATION LIMITATIONS: driving and community activity  PERSONAL FACTORS: 1 comorbidity: Breast CA in remission  are also affecting patient's functional outcome.   REHAB POTENTIAL: Excellent  CLINICAL DECISION MAKING: Evolving/moderate complexity  EVALUATION COMPLEXITY: Moderate   GOALS: Goals reviewed with patient? Yes  SHORT TERM GOALS: Target date: 02-06-23  Pt will be I with initial HEP specific impairment Baseline: routinely goes to [employer] Goal status: ONGOING  2.  When utilizing R UE pain will decrease to at least 5/10   Baseline: 7/10  02-06-23 3-4/10 Goal status: MET  3.  Demonstrate understanding of proper sitting posture and be more conscious of position and posture throughout the day.  Baseline: works at desk job with flexed posture Goal status: ONGOING  4.  Office ergonomic education to prevent flexed posture on computer Baseline: works as bookkeeper Goal status: ONGOING    LONG TERM GOALS: Target date: 02-27-23  Pt will be I with advanced HEP Baseline: limited knowledge Goal status: INITIAL  2.  Pt will be able to wash car or windows without exacerbating pain greater than 2/10 Baseline: Pt  7/10  Goal status: INITIAL  3.  Pt will be able to drive and scan environment without exacerbating pain greater 2/10 Baseline: increases with 2 hours driving Goal status: INITIAL  4.  FOTO will improve from  57%  to  70%   indicating improved functional mobility.  Baseline: 57% eval Goal status: INITIAL  5.  Pt will demonstrate increased UE strength by placing 20 lb weight above head without compensations Baseline: able to lift 15 lb but with compensation and pain Goal status: INITIAL  6.  Pt will be able to sleep on Left side and not wake due to pain for 5 or more restorative hours of sleep at night Baseline: avoids sleeping on Right side due to pain Goal status: INITIAL  PLAN:  PT FREQUENCY: 1-2x/week  PT DURATION: 6 weeks  PLANNED INTERVENTIONS: Therapeutic exercises, Therapeutic activity, Neuromuscular re-education, Balance training, Gait training, Patient/Family education, Self Care, Joint mobilization, Dry Needling, Electrical stimulation, Spinal mobilization, Cryotherapy, Moist heat, Taping, Ionotophoresis 4mg /ml Dexamethasone, Manual therapy, and Re-evaluation  PLAN FOR NEXT SESSION: HEP and TPDN/manual  Garen Lah, PT, ATRIC Certified Exercise Expert for the Aging Adult  02/06/23 11:49 AM Phone: [phone] Fax: [phone]

## 2023-02-06 ENCOUNTER — Encounter: Payer: Self-pay | Admitting: Physical Therapy

## 2023-02-06 ENCOUNTER — Ambulatory Visit: Payer: 59 | Admitting: Physical Therapy

## 2023-02-06 DIAGNOSIS — M25511 Pain in right shoulder: Secondary | ICD-10-CM | POA: Diagnosis not present

## 2023-02-06 DIAGNOSIS — M6281 Muscle weakness (generalized): Secondary | ICD-10-CM

## 2023-02-06 DIAGNOSIS — R293 Abnormal posture: Secondary | ICD-10-CM

## 2023-02-06 DIAGNOSIS — M542 Cervicalgia: Secondary | ICD-10-CM

## 2023-02-06 DIAGNOSIS — G8929 Other chronic pain: Secondary | ICD-10-CM

## 2023-02-06 NOTE — Therapy (Signed)
OUTPATIENT PHYSICAL THERAPY SHOULDER EVALUATION   Patient Name: Evelyn Castro MRN: [id number] DOB:12-05-1965, 57 y.o., female Today's Date: 02/11/2023  END OF SESSION:  PT End of Session - 02/11/23 1017     Visit Number 4    Number of Visits 12    Date for PT Re-Evaluation 02/27/23    Authorization Type [employer]    PT Start Time 1015    PT Stop Time 1100    PT Time Calculation (min) 45 min    Activity Tolerance Patient tolerated treatment well    Behavior During Therapy WFL for tasks assessed/performed                Past Medical History:  Diagnosis Date   AGUS favor benign 12/2015   Review by outside pathologist feel glandular cells were benign with no atypia. Colposcopy was negative. Recommended follow up Pap smear one year.   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    Multiple allergies    Varicose veins    Past Surgical History:  Procedure Laterality Date   ABDOMINAL HYSTERECTOMY  02/25/2011   Procedure: HYSTERECTOMY ABDOMINAL;  Surgeon: Trellis Paganini, MD;  Location: [facility];  Service: Gynecology;  Laterality: N/A;   BREAST BIOPSY Right 11/12/2012   Procedure: RIGHT BREAST WIRE LOCALIZED EXCISION ;  Surgeon: Emelia Loron, MD;  Location: [facility];  Service: General;  Laterality: Right;   BREAST SURGERY     Lumpectomy   VARICOSE VEINS  2000, 2001   Patient Active Problem List   Diagnosis Date Noted   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   Varicose veins     PCP: NO PCP  REFERRING PROVIDER:  Richardean Sale, DO   REFERRING DIAG: M25.511,G89.29 (ICD-10-CM) - Chronic right shoulder pain   THERAPY DIAG:  Chronic right shoulder pain  Cervicalgia  Muscle weakness (generalized)  Abnormal posture  Rationale for Evaluation and Treatment: Rehabilitation  ONSET DATE: injury 2 years ago  SUBJECTIVE:                                                                                                                                                                                       SUBJECTIVE STATEMENT: Pain 2-3/10   EVAL-About 2 years ago I had an injury and I was removing ivy out of window boxes at my parents home and I feel it more in the back of scapula and lower neck. I am constantly aware of discomfort.  When I sleep on my Right shoulder I have tingling in my shoulders and I avoid sleeping on my  R shoulder. Routinely I am at [employer] and I walk for exercise.  Hand dominance: Right  PERTINENT HISTORY: Breast CA remission/healed more than 5 years, hysty  PAIN:  Are you having pain? Yes: NPRS scale: at rest  3/10 but if I move or wash a car 01/07/09 Pain location: R shoulder Pain description: achy occassionally sharp after it is inflamed Aggravating factors: sleeping on R shld, excessive use. Driving more than 2 hours in car, washing windows or my car Relieving factors: sometimes medication but mostly nothing  PRECAUTIONS: None  RED FLAGS: None   WEIGHT BEARING RESTRICTIONS: No  FALLS:  Has patient fallen in last 6 months? No  LIVING ENVIRONMENT: Lives with: lives with their spouse Lives in: House/apartment Stairs: Yes: Internal: 0 steps; none Has following equipment at home: None  OCCUPATION: bookkeeper  PLOF: Independent  PATIENT GOALS:Stop pain around R shoulder /neck.  Not think about pain in shld.  NEXT MD VISIT:   OBJECTIVE:   DIAGNOSTIC FINDINGS:  EXAM: RIGHT SHOULDER - 2+ VIEW   COMPARISON:  None Available.   FINDINGS: There is a soft tissue calcification just lateral to the acromion. No other bony or soft tissue abnormalities are identified.   IMPRESSION: There is a nonspecific soft tissue calcification just lateral to the acromion. No other abnormalities. No cause for pain identified.     Electronically Signed   By: Gerome Sam III M.D.   On: 12/12/2022 15:43  PATIENT SURVEYS:  FOTO 57% predicted  70%  COGNITION: Overall cognitive status: Within functional limits for tasks assessed     SENSATION: WFL  POSTURE: Slight forward head, rounded shld  and Right more forward than Left CERVICAL ROM:   Active ROM A/PROM (deg) eval  Flexion   Extension   Right lateral flexion   Left lateral flexion   Right rotation 54  Left rotation 53   (Blank rows = not tested)  UPPER EXTREMITY ROM:  all motion WNL but with pain at end range of IR and horizontal adduction  Active ROM Right eval Left eval  Shoulder flexion 158 160  Shoulder extension    Shoulder abduction    Shoulder adduction    Shoulder internal rotation    Shoulder external rotation    Elbow flexion    Elbow extension    Wrist flexion    Wrist extension    Wrist ulnar deviation    Wrist radial deviation    Wrist pronation    Wrist supination    (Blank rows = not tested)  UPPER EXTREMITY MMT:  MMT Right eval Left eval  Shoulder flexion 5 5  Shoulder extension 5 5  Shoulder abduction 4+ 5  Shoulder adduction    Shoulder internal rotation 5 5  Shoulder external rotation 4 5  Middle trapezius    Lower trapezius    Elbow flexion    Elbow extension    Wrist flexion    Wrist extension    Wrist ulnar deviation    Wrist radial deviation    Wrist pronation    Wrist supination    Grip strength (lbs) 57 46lb  (Blank rows = not tested)  SHOULDER SPECIAL TESTS: NT  JOINT MOBILITY TESTING:  WNL  PALPATION:  TTP over R upper trap/levator and over infraspinatus  Functional testing   5 x STS  7.26 sec  TODAY'S TREATMENT:  [facility] Adult PT Treatment:  DATE: 02-11-23 Therapeutic Exercise: On Pink foam roller,  supine scapular start pattern with GTB 25 # lift training bar  OH press standing 1 x 8 then with legs staggered 2 x 7 Manual Therapy: STW  over needled muscle Gentle cervical distraction Thoracic ext mobs UPA on R of C- 3 to C-6 Trigger Point Dry-Needling  performed     by Garen Lah Treatment instructions: Expect mild to moderate muscle soreness. S/S of pneumothorax if dry needled over a lung field, and to seek immediate medical attention should they occur. Patient verbalized understanding of these instructions and education.  Patient Consent Given: Yes Education handout provided: Previously provided Muscles treated: UT/LS right. Subscapularis R. R rhomboid and ribs 5,6.7 Electrical stimulation performed: No Parameters: N/A Treatment response/outcome: twitch response noted, pt noted relief  Moist hot pack Self Care: Pt given handouts and discussed posture and body mechanics and answered all questions Lifting principles  [facility] Adult PT Treatment:                                                DATE: 02-06-23 Therapeutic Exercise: Supine star pattern with btb resistance 1 x 15 ER with elbows into towels 1 x 15 Manual Therapy: STW IASTYM over muscle  TPDN  Gentle cervical distraction Thoracic ext mobs UPA on R of C- 3 to C-6 Trigger Point Dry-Needling performed     by Garen Lah Treatment instructions: Expect mild to moderate muscle soreness. S/S of pneumothorax if dry needled over a lung field, and to seek immediate medical attention should they occur. Patient verbalized understanding of these instructions and education.  Patient Consent Given: Yes Education handout provided: Previously provided Muscles treated: UT/LS right. Subscapularis R. R rhomboid and ribs 5,6 7  C -3 on R to T-5 Electrical stimulation performed: No Parameters: N/A Treatment response/outcome: twitch response noted, pt noted relief  Modalities: Cervical moist heat pack  [facility] Adult PT Treatment:                                                DATE: 02-04-23 Therapeutic Exercise: Upper Trapezius Stretch 2 reps - 20-20 sec hold R neck Levator Scapulae Stretch   2 reps - 20-30 hold R neck Shoulder External Rotation and Scapular Retraction with GTB 3 sets -  10 reps - 3-5 sec hold Shoulder extension with GTB  3 x 10 Star pattern with GTB 3 x 10 Use of towel for added UT and LS stretch  Tack and stretch technique Manual Therapy: STW IASTYM over muscle  TPDN  R UT inhibition taping Trigger Point Dry-Needling performed     by Garen Lah Treatment instructions: Expect mild to moderate muscle soreness. S/S of pneumothorax if dry needled over a lung field, and to seek immediate medical attention should they occur. Patient verbalized understanding of these instructions and education.  Patient Consent Given: Yes Education handout provided: Previously provided Muscles treated: UT/LS right. Subscapularis R Electrical stimulation performed: No Parameters: N/A Treatment response/outcome: twitch response noted, pt noted relief  DATE: eval  and issue HEP Trigger Point Dry-Needling performed     by Garen Lah Treatment instructions: Expect mild to moderate muscle soreness. S/S of pneumothorax if dry needled over a lung field, and to seek immediate medical attention should they occur. Patient verbalized understanding of these instructions and education.  Patient Consent Given: Yes Education handout provided: Previously provided Muscles treated: UT/LS right Electrical stimulation performed: No Parameters: N/A Treatment response/outcome: twitch response noted, pt noted relief   PATIENT EDUCATION: Education details: POC, Explanation of findings  FOTO report and issue of HEP, TPDN education and handouts Person educated: Patient Education method: Explanation, Demonstration, Tactile cues, Verbal cues, and Handouts Education comprehension: verbalized understanding, returned demonstration, verbal cues required, tactile cues required, and needs further education  HOME EXERCISE PROGRAM: Access Code: [email or web address] URL:  [email or web address] Date: 02/04/2023 Prepared by: Garen Lah  Program Notes Use towel over R upper trap for added stability with stretch  Exercises - Seated Gentle Upper Trapezius Stretch  - 2 x daily - 7 x weekly - 1 sets - 3 reps - 20-20 sec hold - Gentle Levator Scapulae Stretch  - 2 x daily - 7 x weekly - 1 sets - 3 reps - 20-30 hold - Shoulder External Rotation and Scapular Retraction with Resistance  - 1-2 x daily - 7 x weekly - 3 sets - 10 reps - 3-5 sec hold - Shoulder extension with resistance - Neutral  - 1 x daily - 7 x weekly - 3 sets - 10 reps - Standing Shoulder Horizontal Abduction with Resistance  - 1 x daily - 7 x weekly - 3 sets - 10 reps - Standing Shoulder Diagonal Horizontal Abduction 60/120 Degrees with Resistance  - 1 x daily - 7 x weekly - 3 sets - 10 reps  ASSESSMENT:  CLINICAL IMPRESSION:  Evelyn Castro enters clinic with 2-3/10 pain today in R shoulder/ neck.  Pt reports good compliance with HEP. STG all achieved today.   Pt  consents to TPDN today and is closely monitored throughout session. Session concentrated on body mechanics, posture education and strength with 25# training bar..  Will continue toward progression and achievement of goals.  Pt left with all questions answered from PT perspective and no adverse effects.     EVAL-  Patient is a 57 y.o. female who was seen today for physical therapy evaluation and treatment for chronic R shld pain from an injury pulling ivy at her parents home 2 years ago. She has had ongoing discomfort but notices more pain now when she exerts force through her arm while washing her car or washing windows  She also notes pain when she drives for longer than 2 hours in car.  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 Pt is proactive with health and walks and belongs to a gym. She would like to alleviate pain so she can continue to improve  her overall health and strength.  Pt  will benefit from skilled PT to maximize her AROM/strength and eliminate her pain for functional activities  OBJECTIVE IMPAIRMENTS: decreased activity tolerance, decreased strength, impaired UE functional use, improper body mechanics, postural dysfunction, and pain.   ACTIVITY LIMITATIONS: reach over head and washing car/windows/yardwork  PARTICIPATION LIMITATIONS: driving and community activity  PERSONAL FACTORS: 1 comorbidity: Breast CA in remission  are also affecting patient's functional outcome.   REHAB POTENTIAL: Excellent  CLINICAL DECISION MAKING: Evolving/moderate complexity  EVALUATION  COMPLEXITY: Moderate   GOALS: Goals reviewed with patient? Yes  SHORT TERM GOALS: Target date: 02-06-23  Pt will be I with initial HEP specific impairment Baseline: routinely goes to [employer] Goal status: MET  2.  When utilizing R UE pain will decrease to at least 5/10  Baseline: 7/10 02-06-23 3-4/10 Goal status: MET  3.  Demonstrate understanding of proper sitting posture and be more conscious of position and posture throughout the day.  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  Office ergonomic education to prevent flexed posture on computer Baseline: works as bookkeeper 02-11-23  given handout Goal status: MET    LONG TERM GOALS: Target date: 02-27-23  Pt will be I with advanced HEP Baseline: limited knowledge Goal status: INITIAL  2.  Pt will be able to wash car or windows without exacerbating pain greater than 2/10 Baseline: Pt  7/10  Goal status: INITIAL  3.  Pt will be able to drive and scan environment without exacerbating pain greater 2/10 Baseline: increases with 2 hours driving Goal status: INITIAL  4.  FOTO will improve from  57%  to  70%   indicating improved functional mobility.  Baseline: 57% eval Goal status: INITIAL  5.  Pt will  demonstrate increased UE strength by placing 20 lb weight above head without compensations Baseline: able to lift 15 lb but with compensation and pain Goal status: INITIAL  6.  Pt will be able to sleep on Left side and not wake due to pain for 5 or more restorative hours of sleep at night Baseline: avoids sleeping on Right side due to pain Goal status: INITIAL  PLAN:  PT FREQUENCY: 1-2x/week  PT DURATION: 6 weeks  PLANNED INTERVENTIONS: Therapeutic exercises, Therapeutic activity, Neuromuscular re-education, Balance training, Gait training, Patient/Family education, Self Care, Joint mobilization, Dry Needling, Electrical stimulation, Spinal mobilization, Cryotherapy, Moist heat, Taping, Ionotophoresis 4mg /ml Dexamethasone, Manual therapy, and Re-evaluation  PLAN FOR NEXT SESSION: HEP and TPDN/manual  Garen Lah, PT, ATRIC Certified Exercise Expert for the Aging Adult  02/11/23 1:19 PM Phone: [phone] Fax: [phone]

## 2023-02-11 ENCOUNTER — Ambulatory Visit: Payer: 59 | Admitting: Physical Therapy

## 2023-02-11 ENCOUNTER — Encounter: Payer: Self-pay | Admitting: Physical Therapy

## 2023-02-11 DIAGNOSIS — M6281 Muscle weakness (generalized): Secondary | ICD-10-CM

## 2023-02-11 DIAGNOSIS — G8929 Other chronic pain: Secondary | ICD-10-CM

## 2023-02-11 DIAGNOSIS — R293 Abnormal posture: Secondary | ICD-10-CM

## 2023-02-11 DIAGNOSIS — M542 Cervicalgia: Secondary | ICD-10-CM

## 2023-02-11 DIAGNOSIS — M25511 Pain in right shoulder: Secondary | ICD-10-CM | POA: Diagnosis not present

## 2023-02-11 NOTE — Patient Instructions (Signed)
Sleeping on Back  Place pillow under knees. A pillow with cervical support and a roll around waist are also helpful. Copyright  VHI. All rights reserved.  Sleeping on Side Place pillow between knees. Use cervical support under neck and a roll around waist as needed. Copyright  VHI. All rights reserved.   Sleeping on Stomach   If this is the only desirable sleeping position, place pillow under lower legs, and under stomach or chest as needed.  Posture - Sitting   Sit upright, head facing forward. Try using a roll to support lower back. Keep shoulders relaxed, and avoid rounded back. Keep hips level with knees. Avoid crossing legs for long periods. Stand to Sit / Sit to Stand   To sit: Bend knees to lower self onto front edge of chair, then scoot back on seat. To stand: Reverse sequence by placing one foot forward, and scoot to front of seat. Use rocking motion to stand up.   Work Height and Reach  Ideal work height is no more than 2 to 4 inches below elbow level when standing, and at elbow level when sitting. Reaching should be limited to arm's length, with elbows slightly bent.  Bending  Bend at hips and knees, not back. Keep feet shoulder-width apart.    Posture - Standing   Good posture is important. Avoid slouching and forward head thrust. Maintain curve in low back and align ears over shoul- ders, hips over ankles.  Alternating Positions   Alternate tasks and change positions frequently to reduce fatigue and muscle tension. Take rest breaks. Computer Work   Position work to Art gallery manager. Use proper work and seat height. Keep shoulders back and down, wrists straight, and elbows at right angles. Use chair that provides full back support. Add footrest and lumbar roll as needed.  Getting Into / Out of Car  Lower self onto seat, scoot back, then bring in one leg at a time. Reverse sequence to get out.  Dressing  Lie on back to pull socks or slacks over feet, or sit  and bend leg while keeping back straight.    Housework - Sink  Place one foot on ledge of cabinet under sink when standing at sink for prolonged periods.   Pushing / Pulling  Pushing is preferable to pulling. Keep back in proper alignment, and use leg muscles to do the work.  Deep Squat   Squat and lift with both arms held against upper trunk. Tighten stomach muscles without holding breath. Use smooth movements to avoid jerking.  Avoid Twisting   Avoid twisting or bending back. Pivot around using foot movements, and bend at knees if needed when reaching for articles.  Carrying Luggage   Distribute weight evenly on both sides. Use a cart whenever possible. Do not twist trunk. Move body as a unit.   Lifting Principles Maintain proper posture and head alignment. Slide object as close as possible before lifting. Move obstacles out of the way. Test before lifting; ask for help if too heavy. Tighten stomach muscles without holding breath. Use smooth movements; do not jerk. Use legs to do the work, and pivot with feet. Distribute the work load symmetrically and close to the center of trunk. Push instead of pull whenever possible.   Ask For Help   Ask for help and delegate to others when possible. Coordinate your movements when lifting together, and maintain the low back curve.  Log Roll   Lying on back, bend left knee and place left  arm across chest. Roll all in one movement to the right. Reverse to roll to the left. Always move as one unit. Housework - Sweeping  Use long-handled equipment to avoid stooping.   Housework - Wiping  Position yourself as close as possible to reach work surface. Avoid straining your back.  Laundry - Unloading Wash   To unload small items at bottom of washer, lift leg opposite to arm being used to reach.  Gardening - Raking  Move close to area to be raked. Use arm movements to do the work. Keep back straight and avoid twisting.      Cart  When reaching into cart with one arm, lift opposite leg to keep back straight.   Getting Into / Out of Bed  Lower self to lie down on one side by raising legs and lowering head at the same time. Use arms to assist moving without twisting. Bend both knees to roll onto back if desired. To sit up, start from lying on side, and use same move-ments in reverse. Housework - Vacuuming  Hold the vacuum with arm held at side. Step back and forth to move it, keeping head up. Avoid twisting.   Laundry - Armed forces training and education officer so that bending and twisting can be avoided.   Laundry - Unloading Dryer  Squat down to reach into clothes dryer or use a reacher.  Gardening - Weeding / Psychiatric nurse or Kneel. Knee pads may be helpful.                   Posture Tips DO: - stand tall and erect - keep chin tucked in - keep head and shoulders in alignment - check posture regularly in mirror or large window - pull head back against headrest in car seat;  Change your position often.  Sit with lumbar support. DON'T: - slouch or slump while watching TV or reading - sit, stand or lie in one position  for too long;  Sitting is especially hard on the spine so if you sit at a desk/use the computer, then stand up often!   Copyright  VHI. All rights reserved.  Posture - Standing   Good posture is important. Avoid slouching and forward head thrust. Maintain curve in low back and align ears over shoul- ders, hips over ankles.  Pull your belly button in toward your back bone. Ribs up and chin down   Copyright  VHI. All rights reserved.  Posture - Sitting   Sit upright, head facing forward. Try using a roll to support lower back. Keep shoulders relaxed, and avoid rounded back. Keep hips level with knees. Avoid crossing legs for long periods. Sit on your sit bones and not tail bone   Copyright  VHI. All rights reserved.  \ Garen Lah, PT, ATRIC Certified Exercise  Expert for the Aging Adult  02/11/23 10:31 AM Phone: [phone] Fax: [phone]

## 2023-02-12 NOTE — Therapy (Signed)
OUTPATIENT PHYSICAL THERAPY SHOULDER EVALUATION   Patient Name: Evelyn Castro MRN: [id number] DOB:03/22/1966, 57 y.o., female Today's Date: 02/13/2023  END OF SESSION:  PT End of Session - 02/13/23 1021     Visit Number 5    Number of Visits 12    Date for PT Re-Evaluation 02/27/23    Authorization Type [employer]    PT Start Time 1020    PT Stop Time 1100    PT Time Calculation (min) 40 min    Activity Tolerance Patient tolerated treatment well    Behavior During Therapy WFL for tasks assessed/performed                 Past Medical History:  Diagnosis Date   AGUS favor benign 12/2015   Review by outside pathologist feel glandular cells were benign with no atypia. Colposcopy was negative. Recommended follow up Pap smear one year.   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    Multiple allergies    Varicose veins    Past Surgical History:  Procedure Laterality Date   ABDOMINAL HYSTERECTOMY  02/25/2011   Procedure: HYSTERECTOMY ABDOMINAL;  Surgeon: Trellis Paganini, MD;  Location: [facility];  Service: Gynecology;  Laterality: N/A;   BREAST BIOPSY Right 11/12/2012   Procedure: RIGHT BREAST WIRE LOCALIZED EXCISION ;  Surgeon: Emelia Loron, MD;  Location: [facility];  Service: General;  Laterality: Right;   BREAST SURGERY     Lumpectomy   VARICOSE VEINS  2000, 2001   Patient Active Problem List   Diagnosis Date Noted   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   Varicose veins     PCP: NO PCP  REFERRING PROVIDER:  Richardean Sale, DO   REFERRING DIAG: [phone] (ICD-10-CM) - Chronic right shoulder pain   THERAPY DIAG:  Chronic right shoulder pain  Cervicalgia  Muscle weakness (generalized)  Abnormal posture  Rationale for Evaluation and Treatment: Rehabilitation  ONSET DATE: injury 2 years ago  SUBJECTIVE:                                                                                                                                                                                       SUBJECTIVE STATEMENT: Pain 2/10 a little sore   very sore Tuesday evening.   EVAL-About 2 years ago I had an injury and I was removing ivy out of window boxes at my parents home and I feel it more in the back of scapula and lower neck. I am constantly aware of discomfort.  When I sleep on my Right shoulder I have  tingling in my shoulders and I avoid sleeping on my R shoulder. Routinely I am at [employer] and I walk for exercise.  Hand dominance: Right  PERTINENT HISTORY: Breast CA remission/healed more than 5 years, hysty  PAIN:  Are you having pain? Yes: NPRS scale: at rest  3/10 but if I move or wash a car 01/07/09 Pain location: R shoulder Pain description: achy occassionally sharp after it is inflamed Aggravating factors: sleeping on R shld, excessive use. Driving more than 2 hours in car, washing windows or my car Relieving factors: sometimes medication but mostly nothing  PRECAUTIONS: None  RED FLAGS: None   WEIGHT BEARING RESTRICTIONS: No  FALLS:  Has patient fallen in last 6 months? No  LIVING ENVIRONMENT: Lives with: lives with their spouse Lives in: House/apartment Stairs: Yes: Internal: 0 steps; none Has following equipment at home: None  OCCUPATION: bookkeeper  PLOF: Independent  PATIENT GOALS:Stop pain around R shoulder /neck.  Not think about pain in shld.  NEXT MD VISIT:   OBJECTIVE:   DIAGNOSTIC FINDINGS:  EXAM: RIGHT SHOULDER - 2+ VIEW   COMPARISON:  None Available.   FINDINGS: There is a soft tissue calcification just lateral to the acromion. No other bony or soft tissue abnormalities are identified.   IMPRESSION: There is a nonspecific soft tissue calcification just lateral to the acromion. No other abnormalities. No cause for pain identified.     Electronically Signed   By: Gerome Sam III M.D.   On: 12/12/2022  15:43  PATIENT SURVEYS:  FOTO 57% predicted 70%  COGNITION: Overall cognitive status: Within functional limits for tasks assessed     SENSATION: WFL  POSTURE: Slight forward head, rounded shld  and Right more forward than Left CERVICAL ROM:   Active ROM A/PROM (deg) eval  Flexion   Extension   Right lateral flexion   Left lateral flexion   Right rotation 54  Left rotation 53   (Blank rows = not tested)  UPPER EXTREMITY ROM:  all motion WNL but with pain at end range of IR and horizontal adduction  Active ROM Right eval Left eval  Shoulder flexion 158 160  Shoulder extension    Shoulder abduction    Shoulder adduction    Shoulder internal rotation    Shoulder external rotation    Elbow flexion    Elbow extension    Wrist flexion    Wrist extension    Wrist ulnar deviation    Wrist radial deviation    Wrist pronation    Wrist supination    (Blank rows = not tested)  UPPER EXTREMITY MMT:  MMT Right eval Left eval  Shoulder flexion 5 5  Shoulder extension 5 5  Shoulder abduction 4+ 5  Shoulder adduction    Shoulder internal rotation 5 5  Shoulder external rotation 4 5  Middle trapezius    Lower trapezius    Elbow flexion    Elbow extension    Wrist flexion    Wrist extension    Wrist ulnar deviation    Wrist radial deviation    Wrist pronation    Wrist supination    Grip strength (lbs) 57 46lb  (Blank rows = not tested)  SHOULDER SPECIAL TESTS: NT  JOINT MOBILITY TESTING:  WNL  PALPATION:  TTP over R upper trap/levator and over infraspinatus  Functional testing   5 x STS  7.26 sec  TODAY'S TREATMENT:  Prone IYT  Goals [facility] Adult PT Treatment:  DATE: 02-13-23 Therapeutic Exercise: 25 # lift training bar  OH press standing 1 x 10 then with legs staggered 1x9 and 1 x 8 On physioball red DB bil prone I,y t  without weights 1 x 10  , 2 x 10 with 2 # DB On physioball  2 # DB bil in rows 2 x  10 Supine chest press with 9 # DB in each hand 2 x 10 OH  Arnold press with R and L sitting 1 x 15 each in chair to ground for pt petite size  1 x 15 Manual Therapy: STW  over needled muscle Gentle cervical distraction Thoracic ext mobs Trigger Point Dry-Needling performed     by Garen Lah Treatment instructions: Expect mild to moderate muscle soreness. S/S of pneumothorax if dry needled over a lung field, and to seek immediate medical attention should they occur. Patient verbalized understanding of these instructions and education.  Patient Consent Given: Yes Education handout provided: Previously provided Muscles treated: UT/LS right. Subscapularis R. R rhomboid and ribs 5,6.7 Electrical stimulation performed: Yes Parameters: yes  25 pps  to pt tolerance and raised as pt able every 2 minutes for 11 min Treatment response/outcome: twitch response noted, pt noted relief Moist Hot Pack-  to thoracic and cervical  [facility] Adult PT Treatment:                                                DATE: 02-11-23 Therapeutic Exercise: On Pink foam roller,  supine scapular start pattern with GTB 25 # lift training bar  OH press standing 1 x 8 then with legs staggered 2 x 7 Manual Therapy: STW  over needled muscle Gentle cervical distraction Thoracic ext mobs UPA on R of C- 3 to C-6 Trigger Point Dry-Needling performed     by Garen Lah Treatment instructions: Expect mild to moderate muscle soreness. S/S of pneumothorax if dry needled over a lung field, and to seek immediate medical attention should they occur. Patient verbalized understanding of these instructions and education.  Patient Consent Given: Yes Education handout provided: Previously provided Muscles treated: UT/LS right. Subscapularis R. R rhomboid and ribs 5,6.7 Electrical stimulation performed: No Parameters: N/A Treatment response/outcome: twitch response noted, pt noted relief  Moist hot pack Self Care: Pt given  handouts and discussed posture and body mechanics and answered all questions Lifting principles  [facility] Adult PT Treatment:                                                DATE: 02-06-23 Therapeutic Exercise: Supine star pattern with btb resistance 1 x 15 ER with elbows into towels 1 x 15 Manual Therapy: STW IASTYM over muscle  TPDN  Gentle cervical distraction Thoracic ext mobs UPA on R of C- 3 to C-6 Trigger Point Dry-Needling performed     by Garen Lah Treatment instructions: Expect mild to moderate muscle soreness. S/S of pneumothorax if dry needled over a lung field, and to seek immediate medical attention should they occur. Patient verbalized understanding of these instructions and education.  Patient Consent Given: Yes Education handout provided: Previously provided Muscles treated: UT/LS right. Subscapularis R. R rhomboid and ribs 5,6 7  C -3 on R to T-5  Electrical stimulation performed: No Parameters: N/A Treatment response/outcome: twitch response noted, pt noted relief  Modalities: Cervical moist heat pack  [facility] Adult PT Treatment:                                                DATE: 02-04-23 Therapeutic Exercise: Upper Trapezius Stretch 2 reps - 20-20 sec hold R neck Levator Scapulae Stretch   2 reps - 20-30 hold R neck Shoulder External Rotation and Scapular Retraction with GTB 3 sets - 10 reps - 3-5 sec hold Shoulder extension with GTB  3 x 10 Star pattern with GTB 3 x 10 Use of towel for added UT and LS stretch  Tack and stretch technique Manual Therapy: STW IASTYM over muscle  TPDN  R UT inhibition taping Trigger Point Dry-Needling performed     by Garen Lah Treatment instructions: Expect mild to moderate muscle soreness. S/S of pneumothorax if dry needled over a lung field, and to seek immediate medical attention should they occur. Patient verbalized understanding of these instructions and education.  Patient Consent Given: Yes Education handout  provided: Previously provided Muscles treated: UT/LS right. Subscapularis R Electrical stimulation performed: No Parameters: N/A Treatment response/outcome: twitch response noted, pt noted relief                                                                                                                                       DATE: eval  and issue HEP Trigger Point Dry-Needling performed     by Garen Lah Treatment instructions: Expect mild to moderate muscle soreness. S/S of pneumothorax if dry needled over a lung field, and to seek immediate medical attention should they occur. Patient verbalized understanding of these instructions and education.  Patient Consent Given: Yes Education handout provided: Previously provided Muscles treated: UT/LS right Electrical stimulation performed: No Parameters: N/A Treatment response/outcome: twitch response noted, pt noted relief   PATIENT EDUCATION: Education details: POC, Explanation of findings  FOTO report and issue of HEP, TPDN education and handouts Person educated: Patient Education method: Explanation, Demonstration, Tactile cues, Verbal cues, and Handouts Education comprehension: verbalized understanding, returned demonstration, verbal cues required, tactile cues required, and needs further education  HOME EXERCISE PROGRAM: Access Code: [email or web address] URL: [email or web address] Date: 02/04/2023 Prepared by: Garen Lah  Program Notes Use towel over R upper trap for added stability with stretch  Exercises - Seated Gentle Upper Trapezius Stretch  - 2 x daily - 7 x weekly - 1 sets - 3 reps - 20-20 sec hold - Gentle Levator Scapulae Stretch  - 2 x daily - 7 x weekly - 1 sets - 3 reps - 20-30 hold - Shoulder External Rotation and Scapular Retraction with Resistance  - 1-2 x daily - 7 x weekly - 3 sets -  10 reps - 3-5 sec hold - Shoulder extension with resistance - Neutral  - 1 x daily - 7 x weekly - 3 sets - 10  reps - Standing Shoulder Horizontal Abduction with Resistance  - 1 x daily - 7 x weekly - 3 sets - 10 reps - Standing Shoulder Diagonal Horizontal Abduction 60/120 Degrees with Resistance  - 1 x daily - 7 x weekly - 3 sets - 10 reps  ASSESSMENT:  CLINICAL IMPRESSION:  Inetta Fermo enters clinic with 2/10 pain today in R shoulder/ neck. And remained a 2/10 but tolerated estim with needling well  Pt reports good compliance with HEP.  Pt performed more scapular stabilizing exercises today with no adverse effects.  Pt  consents to TPDN today and is closely monitored throughout session. Will continue toward progression and achievement of goals.  Pt left with all questions answered from PT perspective and no adverse effects.     EVAL-  Patient is a 57 y.o. female who was seen today for physical therapy evaluation and treatment for chronic R shld pain from an injury pulling ivy at her parents home 2 years ago. She has had ongoing discomfort but notices more pain now when she exerts force through her arm while washing her car or washing windows  She also notes pain when she drives for longer than 2 hours in car.  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 Pt is proactive with health and walks and belongs to a gym. She would like to alleviate pain so she can continue to improve her overall health and strength.  Pt  will benefit from skilled PT to maximize her AROM/strength and eliminate her pain for functional activities  OBJECTIVE IMPAIRMENTS: decreased activity tolerance, decreased strength, impaired UE functional use, improper body mechanics, postural dysfunction, and pain.   ACTIVITY LIMITATIONS: reach over head and washing car/windows/yardwork  PARTICIPATION LIMITATIONS: driving and community activity  PERSONAL FACTORS: 1 comorbidity: Breast CA in remission  are also affecting patient's functional outcome.   REHAB POTENTIAL:  Excellent  CLINICAL DECISION MAKING: Evolving/moderate complexity  EVALUATION COMPLEXITY: Moderate   GOALS: Goals reviewed with patient? Yes  SHORT TERM GOALS: Target date: 02-06-23  Pt will be I with initial HEP specific impairment Baseline: routinely goes to [employer] Goal status: MET  2.  When utilizing R UE pain will decrease to at least 5/10  Baseline: 7/10 02-06-23 3-4/10 Goal status: MET  3.  Demonstrate understanding of proper sitting posture and be more conscious of position and posture throughout the day.  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  Office ergonomic education to prevent flexed posture on computer Baseline: works as bookkeeper 02-11-23  given handout Goal status: MET    LONG TERM GOALS: Target date: 02-27-23  Pt will be I with advanced HEP Baseline: limited knowledge Goal status: INITIAL  2.  Pt will be able to wash car or windows without exacerbating pain greater than 2/10 Baseline: Pt  7/10  Goal status: INITIAL  3.  Pt will be able to drive and scan environment without exacerbating pain greater 2/10 Baseline: increases with 2 hours driving Goal status: INITIAL  4.  FOTO will improve from  57%  to  70%   indicating improved functional mobility.  Baseline: 57% eval Goal status: INITIAL  5.  Pt will demonstrate increased  UE strength by placing 20 lb weight above head without compensations Baseline: able to lift 15 lb but with compensation and pain Goal status: INITIAL  6.  Pt will be able to sleep on Left side and not wake due to pain for 5 or more restorative hours of sleep at night Baseline: avoids sleeping on Right side due to pain Goal status: INITIAL  PLAN:  PT FREQUENCY: 1-2x/week  PT DURATION: 6 weeks  PLANNED INTERVENTIONS: Therapeutic exercises, Therapeutic activity, Neuromuscular re-education, Balance training, Gait training, Patient/Family  education, Self Care, Joint mobilization, Dry Needling, Electrical stimulation, Spinal mobilization, Cryotherapy, Moist heat, Taping, Ionotophoresis 4mg /ml Dexamethasone, Manual therapy, and Re-evaluation  PLAN FOR NEXT SESSION: HEP and TPDN/manual  Garen Lah, PT, ATR[facility] Certified Exercise Expert for the Aging Adult  02/13/23 11:11 AM Phone: [phone] Fax: [phone]

## 2023-02-13 ENCOUNTER — Encounter: Payer: Self-pay | Admitting: Physical Therapy

## 2023-02-13 ENCOUNTER — Ambulatory Visit: Payer: 59 | Admitting: Physical Therapy

## 2023-02-13 DIAGNOSIS — R293 Abnormal posture: Secondary | ICD-10-CM

## 2023-02-13 DIAGNOSIS — M542 Cervicalgia: Secondary | ICD-10-CM

## 2023-02-13 DIAGNOSIS — G8929 Other chronic pain: Secondary | ICD-10-CM

## 2023-02-13 DIAGNOSIS — M25511 Pain in right shoulder: Secondary | ICD-10-CM | POA: Diagnosis not present

## 2023-02-13 DIAGNOSIS — M6281 Muscle weakness (generalized): Secondary | ICD-10-CM

## 2023-02-18 ENCOUNTER — Ambulatory Visit: Payer: 59 | Admitting: Physical Therapy

## 2023-02-20 ENCOUNTER — Ambulatory Visit: Payer: 59 | Admitting: Physical Therapy

## 2023-02-25 ENCOUNTER — Ambulatory Visit: Payer: 59 | Admitting: Physical Therapy

## 2023-02-25 DIAGNOSIS — M542 Cervicalgia: Secondary | ICD-10-CM

## 2023-02-25 DIAGNOSIS — R293 Abnormal posture: Secondary | ICD-10-CM

## 2023-02-25 DIAGNOSIS — M25511 Pain in right shoulder: Secondary | ICD-10-CM | POA: Diagnosis not present

## 2023-02-25 DIAGNOSIS — M6281 Muscle weakness (generalized): Secondary | ICD-10-CM

## 2023-02-25 DIAGNOSIS — G8929 Other chronic pain: Secondary | ICD-10-CM

## 2023-02-25 NOTE — Therapy (Signed)
OUTPATIENT PHYSICAL THERAPY SHOULDER EVALUATION   Patient Name: Evelyn Castro MRN: [id number] DOB:07/30/1965, 57 y.o., female Today's Date: 02/25/2023  END OF SESSION:  PT End of Session - 02/25/23 1028     Visit Number 6    Number of Visits 12    Date for PT Re-Evaluation 03/25/23    Authorization Type [employer]    PT Start Time 1020    PT Stop Time 1100    PT Time Calculation (min) 40 min    Activity Tolerance Patient tolerated treatment well    Behavior During Therapy WFL for tasks assessed/performed                  Past Medical History:  Diagnosis Date   AGUS favor benign 12/2015   Review by outside pathologist feel glandular cells were benign with no atypia. Colposcopy was negative. Recommended follow up Pap smear one year.   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    Multiple allergies    Varicose veins    Past Surgical History:  Procedure Laterality Date   ABDOMINAL HYSTERECTOMY  02/25/2011   Procedure: HYSTERECTOMY ABDOMINAL;  Surgeon: Trellis Paganini, MD;  Location: [facility];  Service: Gynecology;  Laterality: N/A;   BREAST BIOPSY Right 11/12/2012   Procedure: RIGHT BREAST WIRE LOCALIZED EXCISION ;  Surgeon: Emelia Loron, MD;  Location: [facility];  Service: General;  Laterality: Right;   BREAST SURGERY     Lumpectomy   VARICOSE VEINS  2000, 2001   Patient Active Problem List   Diagnosis Date Noted   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   Varicose veins     PCP: NO PCP  REFERRING PROVIDER:  Richardean Sale, DO   REFERRING DIAG: M25.511,G89.29 (ICD-10-CM) - Chronic right shoulder pain   THERAPY DIAG:  Chronic right shoulder pain  Cervicalgia  Muscle weakness (generalized)  Abnormal posture  Rationale for Evaluation and Treatment: Rehabilitation  ONSET DATE: injury 2 years ago  SUBJECTIVE:                                                                                                                                                                                       SUBJECTIVE STATEMENT: Pain 2/10 a little sore this week  The worst was a 5/10 this week  but I am overall improveing   EVAL-About 2 years ago I had an injury and I was removing ivy out of window boxes at my parents home and I feel it more in the back of scapula and lower neck. I am constantly aware of  discomfort.  When I sleep on my Right shoulder I have tingling in my shoulders and I avoid sleeping on my R shoulder. Routinely I am at [employer] and I walk for exercise.  Hand dominance: Right  PERTINENT HISTORY: Breast CA remission/healed more than 5 years, hysty  PAIN:  Are you having pain? Yes: NPRS scale: at rest  3/10 but if I move or wash a car 01/07/09 Pain location: R shoulder Pain description: achy occassionally sharp after it is inflamed Aggravating factors: sleeping on R shld, excessive use. Driving more than 2 hours in car, washing windows or my car Relieving factors: sometimes medication but mostly nothing  PRECAUTIONS: None  RED FLAGS: None   WEIGHT BEARING RESTRICTIONS: No  FALLS:  Has patient fallen in last 6 months? No  LIVING ENVIRONMENT: Lives with: lives with their spouse Lives in: House/apartment Stairs: Yes: Internal: 0 steps; none Has following equipment at home: None  OCCUPATION: bookkeeper  PLOF: Independent  PATIENT GOALS:Stop pain around R shoulder /neck.  Not think about pain in shld.  NEXT MD VISIT:   OBJECTIVE:   DIAGNOSTIC FINDINGS:  EXAM: RIGHT SHOULDER - 2+ VIEW   COMPARISON:  None Available.   FINDINGS: There is a soft tissue calcification just lateral to the acromion. No other bony or soft tissue abnormalities are identified.   IMPRESSION: There is a nonspecific soft tissue calcification just lateral to the acromion. No other abnormalities. No cause for pain identified.     Electronically Signed   By: Gerome Sam III M.D.   On: 12/12/2022 15:43  PATIENT SURVEYS:  FOTO 57% predicted 70% 02-25-23  64%  COGNITION: Overall cognitive status: Within functional limits for tasks assessed     SENSATION: WFL  POSTURE: Slight forward head, rounded shld  and Right more forward than Left CERVICAL ROM:   Active ROM A/PROM (deg) eval AROM 02-25-23  Flexion    Extension    Right lateral flexion    Left lateral flexion    Right rotation 54 65  Left rotation 53 61   (Blank rows = not tested)  UPPER EXTREMITY ROM:  all motion WNL but with pain at end range of IR and horizontal adduction  Active ROM Right eval Left eval R/L 02-25-23  Shoulder flexion 158 160   Shoulder extension     Shoulder abduction     Shoulder adduction   WNL without pain  Shoulder internal rotation     Shoulder external rotation     Elbow flexion     Elbow extension     Wrist flexion     Wrist extension     Wrist ulnar deviation     Wrist radial deviation     Wrist pronation     Wrist supination     (Blank rows = not tested)  UPPER EXTREMITY MMT:  MMT Right eval Left eval  Shoulder flexion 5 5  Shoulder extension 5 5  Shoulder abduction 4+ 5  Shoulder adduction    Shoulder internal rotation 5 5  Shoulder external rotation 4 5  Middle trapezius    Lower trapezius    Elbow flexion    Elbow extension    Wrist flexion    Wrist extension    Wrist ulnar deviation    Wrist radial deviation    Wrist pronation    Wrist supination    Grip strength (lbs) 57 46lb  (Blank rows = not tested)  SHOULDER SPECIAL TESTS: NT  JOINT MOBILITY TESTING:  WNL  PALPATION:  TTP over R upper trap/levator and over infraspinatus  Functional testing   5 x STS  7.26 sec  TODAY'S TREATMENT:  [facility] Adult PT Treatment:                                                DATE: 02-25-23 Prone IYT  Goals Therapeutic Exercise: OH press with 20 lb DB wt  2 x 10  25 # Gorilla row  3 x 8 Supine chest press with 9 # DB in each  hand 2 x 10 Manual Therapy:  STW  over R UT/LS rhomboids, infraspinatus Thoracic ext mobs Gentle cervical distraction Trigger Point Dry-Needling performed     by Garen Lah Treatment instructions: Expect mild to moderate muscle soreness. S/S of pneumothorax if dry needled over a lung field, and to seek immediate medical attention should they occur. Patient verbalized understanding of these instructions and education.  Patient Consent Given: Yes Education handout provided: Previously provided Muscles treated: UT/LS right. Subscapularis R. R rhomboid  Electrical stimulation performed: Yes Parameters: yes  25 pps  to pt tolerance and raised as pt able every 2 minutes for 10 min Treatment response/outcome: twitch response noted, pt noted relief Moist Hot Pack-  to thoracic and cervical  Modalities: Moist hot pack   [facility] Adult PT Treatment:                                                DATE: 02-13-23 Therapeutic Exercise: 25 # lift training bar  OH press standing 1 x 10 then with legs staggered 1x9 and 1 x 8 On physioball red DB bil prone I,y t  without weights 1 x 10  , 2 x 10 with 2 # DB On physioball  2 # DB bil in rows 2 x 10 Supine chest press with 9 # DB in each hand 2 x 10 OH  Arnold press with R and L sitting 1 x 15 each in chair to ground for pt petite size  1 x 15 Manual Therapy: STW  over needled muscle Gentle cervical distraction Thoracic ext mobs Trigger Point Dry-Needling performed     by Garen Lah Treatment instructions: Expect mild to moderate muscle soreness. S/S of pneumothorax if dry needled over a lung field, and to seek immediate medical attention should they occur. Patient verbalized understanding of these instructions and education.  Patient Consent Given: Yes Education handout provided: Previously provided Muscles treated: UT/LS right. Subscapularis R. R rhomboid and ribs 5,6.7 Electrical stimulation performed: Yes Parameters: yes  25 pps  to pt  tolerance and raised as pt able every 2 minutes for 11 min Treatment response/outcome: twitch response noted, pt noted relief Moist Hot Pack-  to thoracic and cervical  [facility] Adult PT Treatment:                                                DATE: 02-11-23 Therapeutic Exercise: On Pink foam roller,  supine scapular start pattern with GTB 25 # lift training bar  OH press standing 1 x 8 then with legs  staggered 2 x 7 Manual Therapy: STW  over needled muscle Gentle cervical distraction Thoracic ext mobs UPA on R of C- 3 to C-6 Trigger Point Dry-Needling performed     by Garen Lah Treatment instructions: Expect mild to moderate muscle soreness. S/S of pneumothorax if dry needled over a lung field, and to seek immediate medical attention should they occur. Patient verbalized understanding of these instructions and education.  Patient Consent Given: Yes Education handout provided: Previously provided Muscles treated: UT/LS right. Subscapularis R. R rhomboid and ribs 5,6.7 Electrical stimulation performed: No Parameters: N/A Treatment response/outcome: twitch response noted, pt noted relief  Moist hot pack Self Care: Pt given handouts and discussed posture and body mechanics and answered all questions Lifting principles  [facility] Adult PT Treatment:                                                DATE: 02-06-23 Therapeutic Exercise: Supine star pattern with btb resistance 1 x 15 ER with elbows into towels 1 x 15 Manual Therapy: STW IASTYM over muscle  TPDN  Gentle cervical distraction Thoracic ext mobs UPA on R of C- 3 to C-6 Trigger Point Dry-Needling performed     by Garen Lah Treatment instructions: Expect mild to moderate muscle soreness. S/S of pneumothorax if dry needled over a lung field, and to seek immediate medical attention should they occur. Patient verbalized understanding of these instructions and education.  Patient Consent Given: Yes Education handout provided:  Previously provided Muscles treated: UT/LS right. Subscapularis R. R rhomboid and ribs 5,6 7  C -3 on R to T-5 Electrical stimulation performed: No Parameters: N/A Treatment response/outcome: twitch response noted, pt noted relief  Modalities: Cervical moist heat pack  [facility] Adult PT Treatment:                                                DATE: 02-04-23 Therapeutic Exercise: Upper Trapezius Stretch 2 reps - 20-20 sec hold R neck Levator Scapulae Stretch   2 reps - 20-30 hold R neck Shoulder External Rotation and Scapular Retraction with GTB 3 sets - 10 reps - 3-5 sec hold Shoulder extension with GTB  3 x 10 Star pattern with GTB 3 x 10 Use of towel for added UT and LS stretch  Tack and stretch technique Manual Therapy: STW IASTYM over muscle  TPDN  R UT inhibition taping Trigger Point Dry-Needling performed     by Garen Lah Treatment instructions: Expect mild to moderate muscle soreness. S/S of pneumothorax if dry needled over a lung field, and to seek immediate medical attention should they occur. Patient verbalized understanding of these instructions and education.  Patient Consent Given: Yes Education handout provided: Previously provided Muscles treated: UT/LS right. Subscapularis R Electrical stimulation performed: No Parameters: N/A Treatment response/outcome: twitch response noted, pt noted relief  DATE: eval  and issue HEP Trigger Point Dry-Needling performed     by Garen Lah Treatment instructions: Expect mild to moderate muscle soreness. S/S of pneumothorax if dry needled over a lung field, and to seek immediate medical attention should they occur. Patient verbalized understanding of these instructions and education.  Patient Consent Given: Yes Education handout provided: Previously provided Muscles treated: UT/LS  right Electrical stimulation performed: No Parameters: N/A Treatment response/outcome: twitch response noted, pt noted relief   PATIENT EDUCATION: Education details: POC, Explanation of findings  FOTO report and issue of HEP, TPDN education and handouts Person educated: Patient Education method: Explanation, Demonstration, Tactile cues, Verbal cues, and Handouts Education comprehension: verbalized understanding, returned demonstration, verbal cues required, tactile cues required, and needs further education  HOME EXERCISE PROGRAM: Access Code: [email or web address] URL: [email or web address] Date: 02/04/2023 Prepared by: Garen Lah  Program Notes Use towel over R upper trap for added stability with stretch  Exercises - Seated Gentle Upper Trapezius Stretch  - 2 x daily - 7 x weekly - 1 sets - 3 reps - 20-20 sec hold - Gentle Levator Scapulae Stretch  - 2 x daily - 7 x weekly - 1 sets - 3 reps - 20-30 hold - Shoulder External Rotation and Scapular Retraction with Resistance  - 1-2 x daily - 7 x weekly - 3 sets - 10 reps - 3-5 sec hold - Shoulder extension with resistance - Neutral  - 1 x daily - 7 x weekly - 3 sets - 10 reps - Standing Shoulder Horizontal Abduction with Resistance  - 1 x daily - 7 x weekly - 3 sets - 10 reps - Standing Shoulder Diagonal Horizontal Abduction 60/120 Degrees with Resistance  - 1 x daily - 7 x weekly - 3 sets - 10 reps  ASSESSMENT:  CLINICAL IMPRESSION:  Evelyn Castro enters clinic with 2/10 pain today in R shoulder/ neck. And remained a 2/10 but tolerated estim with needling well  Pt reports good compliance with HEP.  Pt performed more scapular stabilizing exercises today with no adverse effects.  Pt  consents to TPDN today and is closely monitored throughout session. Will continue toward progression and achievement of goals.  Pt left with all questions answered from PT perspective and no adverse effects.     EVAL-  Patient is a 57 y.o. female who was  seen today for physical therapy evaluation and treatment for chronic R shld pain from an injury pulling ivy at her parents home 2 years ago. She has had ongoing discomfort but notices more pain now when she exerts force through her arm while washing her car or washing windows  She also notes pain when she drives for longer than 2 hours in car.  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 Pt is proactive with health and walks and belongs to a gym. She would like to alleviate pain so she can continue to improve her overall health and strength.  Pt  will benefit from skilled PT to maximize her AROM/strength and eliminate her pain for functional activities  OBJECTIVE IMPAIRMENTS: decreased activity tolerance, decreased strength, impaired UE functional use, improper body mechanics, postural dysfunction, and pain.   ACTIVITY LIMITATIONS: reach over head and washing car/windows/yardwork  PARTICIPATION LIMITATIONS: driving and community activity  PERSONAL FACTORS: 1 comorbidity: Breast CA in remission  are also affecting patient's functional outcome.   REHAB POTENTIAL: Excellent  CLINICAL DECISION MAKING: Evolving/moderate  complexity  EVALUATION COMPLEXITY: Moderate   GOALS: Goals reviewed with patient? Yes  SHORT TERM GOALS: Target date: 02-06-23  Pt will be I with initial HEP specific impairment Baseline: routinely goes to [employer] Goal status: MET  2.  When utilizing R UE pain will decrease to at least 5/10  Baseline: 7/10 02-06-23 3-4/10 Goal status: MET  3.  Demonstrate understanding of proper sitting posture and be more conscious of position and posture throughout the day.  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  Office ergonomic education to prevent flexed posture on computer Baseline: works as bookkeeper 02-11-23  given  handout Goal status: MET    LONG TERM GOALS: Target date: 02-27-23  Pt will be I with advanced HEP Baseline: limited knowledge Goal status:ONGOING  2.  Pt will be able to wash car or windows without exacerbating pain greater than 2/10 Baseline: Pt  7/10  02-25-23  3/10 Goal status: ONGOING  3.  Pt will be able to drive and scan environment without exacerbating pain greater 2/10 Baseline: increases with 2 hours driving 1-61-09  short term driving good but longer trips greater than 2 hours difficult Goal status:ONGOING  4.  FOTO will improve from  57%  to  70%   indicating improved functional mobility.  Baseline: 57% eval 02-25-23  64% Goal status:ONGOING  5.  Pt will demonstrate increased UE strength by placing 20 lb weight above head without compensations Baseline: able to lift 15 lb but with compensation and pain 02-25-23 able to lift above head but compensations to place on shelf Goal status: ONGOING  6.  Pt will be able to sleep on Left side and not wake due to pain for 5 or more restorative hours of sleep at night Baseline: avoids sleeping on Right side due to pain 02-25-23 Rarely will wake up but wakes up with shoulder sore Goal status: Partially Met  PLAN:  PT FREQUENCY: 1-2x/week extended 1 x a week   PT DURATION: 6 weeks extended 4 weeks to complete RX  PLANNED INTERVENTIONS: Therapeutic exercises, Therapeutic activity, Neuromuscular re-education, Balance training, Gait training, Patient/Family education, Self Care, Joint mobilization, Dry Needling, Electrical stimulation, Spinal mobilization, Cryotherapy, Moist heat, Taping, Ionotophoresis 4mg /ml Dexamethasone, Manual therapy, and Re-evaluation  PLAN FOR NEXT SESSION: HEP and TPDN/manual  Garen Lah, PT, ATR[facility] Certified Exercise Expert for the Aging Adult  02/25/23 1:28 PM Phone: [phone] Fax: [phone]

## 2023-02-27 ENCOUNTER — Encounter: Payer: Self-pay | Admitting: Physical Therapy

## 2023-02-27 ENCOUNTER — Ambulatory Visit: Payer: 59 | Admitting: Physical Therapy

## 2023-02-27 DIAGNOSIS — M6281 Muscle weakness (generalized): Secondary | ICD-10-CM

## 2023-02-27 DIAGNOSIS — G8929 Other chronic pain: Secondary | ICD-10-CM

## 2023-02-27 DIAGNOSIS — M542 Cervicalgia: Secondary | ICD-10-CM

## 2023-02-27 DIAGNOSIS — M25511 Pain in right shoulder: Secondary | ICD-10-CM | POA: Diagnosis not present

## 2023-02-27 DIAGNOSIS — R293 Abnormal posture: Secondary | ICD-10-CM

## 2023-02-27 NOTE — Therapy (Signed)
OUTPATIENT PHYSICAL THERAPY SHOULDER TREATMENT   Patient Name: Evelyn Castro MRN: [id number] DOB:03-Jul-1965, 57 y.o., female Today's Date: 02/27/2023  END OF SESSION:  PT End of Session - 02/27/23 1029     Visit Number 7    Number of Visits 12    Date for PT Re-Evaluation 03/25/23    Authorization Type [employer]    PT Start Time 1017    PT Stop Time 1102    PT Time Calculation (min) 45 min    Activity Tolerance Patient tolerated treatment well    Behavior During Therapy [facility] for tasks assessed/performed                   Past Medical History:  Diagnosis Date   AGUS favor benign 12/2015   Review by outside pathologist feel glandular cells were benign with no atypia. Colposcopy was negative. Recommended follow up Pap smear one year.   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    Multiple allergies    Varicose veins    Past Surgical History:  Procedure Laterality Date   ABDOMINAL HYSTERECTOMY  02/25/2011   Procedure: HYSTERECTOMY ABDOMINAL;  Surgeon: Trellis Paganini, MD;  Location: [facility];  Service: Gynecology;  Laterality: N/A;   BREAST BIOPSY Right 11/12/2012   Procedure: RIGHT BREAST WIRE LOCALIZED EXCISION ;  Surgeon: Emelia Loron, MD;  Location: [facility];  Service: General;  Laterality: Right;   BREAST SURGERY     Lumpectomy   VARICOSE VEINS  2000, 2001   Patient Active Problem List   Diagnosis Date Noted   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   Varicose veins     PCP: NO PCP  REFERRING PROVIDER:  Richardean Sale, DO   REFERRING DIAG: M25.511,G89.29 (ICD-10-CM) - Chronic right shoulder pain   THERAPY DIAG:  Chronic right shoulder pain  Cervicalgia  Muscle weakness (generalized)  Abnormal posture  Rationale for Evaluation and Treatment: Rehabilitation  ONSET DATE: injury 2 years ago  SUBJECTIVE:                                                                                                                                                                                       SUBJECTIVE STATEMENT: Pt with 3/10 pain and sore in posterior deltoid   EVAL-About 2 years ago I had an injury and I was removing ivy out of window boxes at my parents home and I feel it more in the back of scapula and lower neck. I am constantly aware of discomfort.  When I sleep on my Right shoulder I have  tingling in my shoulders and I avoid sleeping on my R shoulder. Routinely I am at [employer] and I walk for exercise.  Hand dominance: Right  PERTINENT HISTORY: Breast CA remission/healed more than 5 years, hysty  PAIN:  Are you having pain? Yes: NPRS scale: at rest  3/10 but if I move or wash a car 01/07/09 Pain location: R shoulder Pain description: achy occassionally sharp after it is inflamed Aggravating factors: sleeping on R shld, excessive use. Driving more than 2 hours in car, washing windows or my car Relieving factors: sometimes medication but mostly nothing  PRECAUTIONS: None  RED FLAGS: None   WEIGHT BEARING RESTRICTIONS: No  FALLS:  Has patient fallen in last 6 months? No  LIVING ENVIRONMENT: Lives with: lives with their spouse Lives in: House/apartment Stairs: Yes: Internal: 0 steps; none Has following equipment at home: None  OCCUPATION: bookkeeper  PLOF: Independent  PATIENT GOALS:Stop pain around R shoulder /neck.  Not think about pain in shld.  NEXT MD VISIT:   OBJECTIVE:   DIAGNOSTIC FINDINGS:  EXAM: RIGHT SHOULDER - 2+ VIEW   COMPARISON:  None Available.   FINDINGS: There is a soft tissue calcification just lateral to the acromion. No other bony or soft tissue abnormalities are identified.   IMPRESSION: There is a nonspecific soft tissue calcification just lateral to the acromion. No other abnormalities. No cause for pain identified.     Electronically Signed   By: Gerome Sam III M.D.   On: 12/12/2022  15:43  PATIENT SURVEYS:  FOTO 57% predicted 70% 02-25-23  64%  COGNITION: Overall cognitive status: Within functional limits for tasks assessed     SENSATION: WFL  POSTURE: Slight forward head, rounded shld  and Right more forward than Left CERVICAL ROM:   Active ROM A/PROM (deg) eval AROM 02-25-23  Flexion    Extension    Right lateral flexion    Left lateral flexion    Right rotation 54 65  Left rotation 53 61   (Blank rows = not tested)  UPPER EXTREMITY ROM:  all motion WNL but with pain at end range of IR and horizontal adduction  Active ROM Right eval Left eval R/L 02-25-23  Shoulder flexion 158 160   Shoulder extension     Shoulder abduction     Shoulder adduction   WNL without pain  Shoulder internal rotation     Shoulder external rotation     Elbow flexion     Elbow extension     Wrist flexion     Wrist extension     Wrist ulnar deviation     Wrist radial deviation     Wrist pronation     Wrist supination     (Blank rows = not tested)  UPPER EXTREMITY MMT:  MMT Right eval Left eval  Shoulder flexion 5 5  Shoulder extension 5 5  Shoulder abduction 4+ 5  Shoulder adduction    Shoulder internal rotation 5 5  Shoulder external rotation 4 5  Middle trapezius    Lower trapezius    Elbow flexion    Elbow extension    Wrist flexion    Wrist extension    Wrist ulnar deviation    Wrist radial deviation    Wrist pronation    Wrist supination    Grip strength (lbs) 57 46lb  (Blank rows = not tested)  SHOULDER SPECIAL TESTS: NT  JOINT MOBILITY TESTING:  WNL  PALPATION:  TTP over R upper trap/levator and  over infraspinatus  Functional testing   5 x STS  7.26 sec  TODAY'S TREATMENT:  [facility] Adult PT Treatment:                                                DATE: 02-27-23 Therapeutic Exercise: OH press with 25 # training bar 1 x 10 feet even, then 2x 10 feet staggered Bent over row with UE planted and Knee on mat for stability 3 x 10 on R and L  each with 25 # KB Supine bench press with 10 # DB  3 x 10 Manual Therapy: STW to R deltoid  STW  over R UT/LS rhomboids, infraspinatus Thoracic ext mobs Gentle cervical distraction Trigger Point Dry-Needling performed     by Garen Lah Treatment instructions: Expect mild to moderate muscle soreness. S/S of pneumothorax if dry needled over a lung field, and to seek immediate medical attention should they occur. Patient verbalized understanding of these instructions and education.  Patient Consent Given: Yes Education handout provided: Previously provided Muscles treated: UT/LS right. Subscapularis R. R rhomboid  Electrical stimulation performed: Yes Parameters: yes  25 pps  to pt tolerance and raised as pt able every 2 minutes for 10 min Treatment response/outcome: twitch response noted, pt noted relief Moist Hot Pack-  to thoracic and cervical  [facility] Adult PT Treatment:                                                DATE: 02-25-23 Prone IYT  Goals Therapeutic Exercise: OH press with 20 lb DB wt  2 x 10  25 # Gorilla row  3 x 8 Supine chest press with 9 # DB in each hand 2 x 10 Manual Therapy:  STW  over R UT/LS rhomboids, infraspinatus STW to posterior deltoid Thoracic ext mobs Gentle cervical distraction Trigger Point Dry-Needling performed     by Garen Lah Treatment instructions: Expect mild to moderate muscle soreness. S/S of pneumothorax if dry needled over a lung field, and to seek immediate medical attention should they occur. Patient verbalized understanding of these instructions and education.  Patient Consent Given: Yes Education handout provided: Previously provided Muscles treated: UT/LS right. Subscapularis R. R rhomboid  Electrical stimulation performed: Yes Parameters: yes  25 pps  to pt tolerance and raised as pt able every 2 minutes for 10 min Treatment response/outcome: twitch response noted, pt noted relief Moist Hot Pack-  to thoracic and  cervical  Modalities: Moist hot pack   [facility] Adult PT Treatment:                                                DATE: 02-13-23 Therapeutic Exercise: 25 # lift training bar  OH press standing 1 x 10 then with legs staggered 1x9 and 1 x 8 On physioball red DB bil prone I,y t  without weights 1 x 10  , 2 x 10 with 2 # DB On physioball  2 # DB bil in rows 2 x 10 Supine chest press with 9 # DB in each hand 2 x 10  OH  Arnold press with R and L sitting 1 x 15 each in chair to ground for pt petite size  1 x 15 Manual Therapy: STW  over needled muscle Gentle cervical distraction Thoracic ext mobs Trigger Point Dry-Needling performed     by Garen Lah Treatment instructions: Expect mild to moderate muscle soreness. S/S of pneumothorax if dry needled over a lung field, and to seek immediate medical attention should they occur. Patient verbalized understanding of these instructions and education.  Patient Consent Given: Yes Education handout provided: Previously provided Muscles treated: UT/LS right. Subscapularis R. R rhomboid and ribs 5,6.7 Electrical stimulation performed: Yes Parameters: yes  25 pps  to pt tolerance and raised as pt able every 2 minutes for 11 min Treatment response/outcome: twitch response noted, pt noted relief Moist Hot Pack-  to thoracic and cervical  [facility] Adult PT Treatment:                                                DATE: 02-11-23 Therapeutic Exercise: On Pink foam roller,  supine scapular start pattern with GTB 25 # lift training bar  OH press standing 1 x 8 then with legs staggered 2 x 7 Manual Therapy: STW  over needled muscle Gentle cervical distraction Thoracic ext mobs UPA on R of C- 3 to C-6 Trigger Point Dry-Needling performed     by Garen Lah Treatment instructions: Expect mild to moderate muscle soreness. S/S of pneumothorax if dry needled over a lung field, and to seek immediate medical attention should they occur. Patient verbalized  understanding of these instructions and education.  Patient Consent Given: Yes Education handout provided: Previously provided Muscles treated: UT/LS right. Subscapularis R. R rhomboid and ribs 5,6.7 Electrical stimulation performed: No Parameters: N/A Treatment response/outcome: twitch response noted, pt noted relief  Moist hot pack Self Care: Pt given handouts and discussed posture and body mechanics and answered all questions Lifting principles  [facility] Adult PT Treatment:                                                DATE: 02-06-23 Therapeutic Exercise: Supine star pattern with btb resistance 1 x 15 ER with elbows into towels 1 x 15 Manual Therapy: STW IASTYM over muscle  TPDN  Gentle cervical distraction Thoracic ext mobs UPA on R of C- 3 to C-6 Trigger Point Dry-Needling performed     by Garen Lah Treatment instructions: Expect mild to moderate muscle soreness. S/S of pneumothorax if dry needled over a lung field, and to seek immediate medical attention should they occur. Patient verbalized understanding of these instructions and education.  Patient Consent Given: Yes Education handout provided: Previously provided Muscles treated: UT/LS right. Subscapularis R. R rhomboid and ribs 5,6 7  C -3 on R to T-5 Electrical stimulation performed: No Parameters: N/A Treatment response/outcome: twitch response noted, pt noted relief  Modalities: Cervical moist heat pack  [facility] Adult PT Treatment:                                                DATE: 02-04-23  Therapeutic Exercise: Upper Trapezius Stretch 2 reps - 20-20 sec hold R neck Levator Scapulae Stretch   2 reps - 20-30 hold R neck Shoulder External Rotation and Scapular Retraction with GTB 3 sets - 10 reps - 3-5 sec hold Shoulder extension with GTB  3 x 10 Star pattern with GTB 3 x 10 Use of towel for added UT and LS stretch  Tack and stretch technique Manual Therapy: STW IASTYM over muscle  TPDN  R UT inhibition  taping Trigger Point Dry-Needling performed     by Garen Lah Treatment instructions: Expect mild to moderate muscle soreness. S/S of pneumothorax if dry needled over a lung field, and to seek immediate medical attention should they occur. Patient verbalized understanding of these instructions and education.  Patient Consent Given: Yes Education handout provided: Previously provided Muscles treated: UT/LS right. Subscapularis R Electrical stimulation performed: No Parameters: N/A Treatment response/outcome: twitch response noted, pt noted relief                                                                                                                                       DATE: eval  and issue HEP Trigger Point Dry-Needling performed     by Garen Lah Treatment instructions: Expect mild to moderate muscle soreness. S/S of pneumothorax if dry needled over a lung field, and to seek immediate medical attention should they occur. Patient verbalized understanding of these instructions and education.  Patient Consent Given: Yes Education handout provided: Previously provided Muscles treated: UT/LS right Electrical stimulation performed: No Parameters: N/A Treatment response/outcome: twitch response noted, pt noted relief   PATIENT EDUCATION: Education details: POC, Explanation of findings  FOTO report and issue of HEP, TPDN education and handouts Person educated: Patient Education method: Explanation, Demonstration, Tactile cues, Verbal cues, and Handouts Education comprehension: verbalized understanding, returned demonstration, verbal cues required, tactile cues required, and needs further education  HOME EXERCISE PROGRAM: Access Code: [email or web address] URL: [email or web address] Date: 02/04/2023 Prepared by: Garen Lah  Program Notes Use towel over R upper trap for added stability with stretch  Exercises - Seated Gentle Upper Trapezius Stretch  - 2 x  daily - 7 x weekly - 1 sets - 3 reps - 20-20 sec hold - Gentle Levator Scapulae Stretch  - 2 x daily - 7 x weekly - 1 sets - 3 reps - 20-30 hold - Shoulder External Rotation and Scapular Retraction with Resistance  - 1-2 x daily - 7 x weekly - 3 sets - 10 reps - 3-5 sec hold - Shoulder extension with resistance - Neutral  - 1 x daily - 7 x weekly - 3 sets - 10 reps - Standing Shoulder Horizontal Abduction with Resistance  - 1 x daily - 7 x weekly - 3 sets - 10 reps - Standing Shoulder Diagonal Horizontal Abduction 60/120 Degrees with Resistance  - 1 x daily - 7 x weekly - 3 sets -  10 reps  ASSESSMENT:  CLINICAL IMPRESSION:  Evelyn Castro enters clinic with 3/10 pain today in R shoulder and posterior deltoid. Pt is able to continue with exercise and added weight to [facility] press.   Pt reports good compliance with HEP at home and is mostly compliant with HEP. Marland Kitchen  Pt  consents to TPDN today and is closely monitored throughout session. With e stim.  Will continue toward progression and achievement of goals.  Pt with good sustained twitching of muscle with estim  Pt left with all questions answered from PT perspective and no adverse effects.     EVAL-  Patient is a 57 y.o. female who was seen today for physical therapy evaluation and treatment for chronic R shld pain from an injury pulling ivy at her parents home 2 years ago. She has had ongoing discomfort but notices more pain now when she exerts force through her arm while washing her car or washing windows  She also notes pain when she drives for longer than 2 hours in car.  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 Pt is proactive with health and walks and belongs to a gym. She would like to alleviate pain so she can continue to improve her overall health and strength.  Pt  will benefit from skilled PT to maximize her AROM/strength and eliminate her pain for functional  activities  OBJECTIVE IMPAIRMENTS: decreased activity tolerance, decreased strength, impaired UE functional use, improper body mechanics, postural dysfunction, and pain.   ACTIVITY LIMITATIONS: reach over head and washing car/windows/yardwork  PARTICIPATION LIMITATIONS: driving and community activity  PERSONAL FACTORS: 1 comorbidity: Breast CA in remission  are also affecting patient's functional outcome.   REHAB POTENTIAL: Excellent  CLINICAL DECISION MAKING: Evolving/moderate complexity  EVALUATION COMPLEXITY: Moderate   GOALS: Goals reviewed with patient? Yes  SHORT TERM GOALS: Target date: 02-06-23  Pt will be I with initial HEP specific impairment Baseline: routinely goes to [employer] Goal status: MET  2.  When utilizing R UE pain will decrease to at least 5/10  Baseline: 7/10 02-06-23 3-4/10 Goal status: MET  3.  Demonstrate understanding of proper sitting posture and be more conscious of position and posture throughout the day.  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  Office ergonomic education to prevent flexed posture on computer Baseline: works as bookkeeper 02-11-23  given handout Goal status: MET    LONG TERM GOALS: Target date: 02-27-23  Pt will be I with advanced HEP Baseline: limited knowledge Goal status:ONGOING  2.  Pt will be able to wash car or windows without exacerbating pain greater than 2/10 Baseline: Pt  7/10  02-25-23  3/10 Goal status: ONGOING  3.  Pt will be able to drive and scan environment without exacerbating pain greater 2/10 Baseline: increases with 2 hours driving 9-60-45  short term driving good but longer trips greater than 2 hours difficult Goal status:ONGOING  4.  FOTO will improve from  57%  to  70%   indicating improved functional mobility.  Baseline: 57% eval 02-25-23  64% Goal status:ONGOING  5.  Pt will demonstrate increased UE strength by  placing 20 lb weight above head without compensations Baseline: able to lift 15 lb but with compensation and pain 02-25-23 able to lift above head but compensations to place on shelf Goal status: ONGOING  6.  Pt will be able to sleep on Left side and not wake due to pain for 5 or more restorative hours of sleep at night Baseline: avoids sleeping on Right side due to pain 02-25-23 Rarely will wake up but wakes up with shoulder sore Goal status: Partially Met  PLAN:  PT FREQUENCY: 1-2x/week extended 1 x a week   PT DURATION: 6 weeks extended 4 weeks to complete RX  PLANNED INTERVENTIONS: Therapeutic exercises, Therapeutic activity, Neuromuscular re-education, Balance training, Gait training, Patient/Family education, Self Care, Joint mobilization, Dry Needling, Electrical stimulation, Spinal mobilization, Cryotherapy, Moist heat, Taping, Ionotophoresis 4mg /ml Dexamethasone, Manual therapy, and Re-evaluation  PLAN FOR NEXT SESSION: HEP and TPDN/manual  Garen Lah, PT, ATRIC Certified Exercise Expert for the Aging Adult  02/27/23 11:03 AM Phone: [phone] Fax: [phone]

## 2023-02-28 ENCOUNTER — Ambulatory Visit: Payer: 59 | Admitting: Sports Medicine

## 2023-03-10 NOTE — Therapy (Signed)
OUTPATIENT PHYSICAL THERAPY SHOULDER TREATMENT   Patient Name: Evelyn Castro MRN: [id number] DOB:[birth year], 57 y.o., female Today's Date: 03/10/2023  END OF SESSION:          Past Medical History:  Diagnosis Date   AGUS favor benign 12/2015   Review by outside pathologist feel glandular cells were benign with no atypia. Colposcopy was negative. Recommended follow up Pap smear one year.   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    Multiple allergies    Varicose veins    Past Surgical History:  Procedure Laterality Date   ABDOMINAL HYSTERECTOMY  02/25/2011   Procedure: HYSTERECTOMY ABDOMINAL;  Surgeon: Trellis Paganini, MD;  Location: [facility];  Service: Gynecology;  Laterality: N/A;   BREAST BIOPSY Right 11/12/2012   Procedure: RIGHT BREAST WIRE LOCALIZED EXCISION ;  Surgeon: Emelia Loron, MD;  Location: [facility];  Service: General;  Laterality: Right;   BREAST SURGERY     Lumpectomy   VARICOSE VEINS  2000, 2001   Patient Active Problem List   Diagnosis Date Noted   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  Rationale for Evaluation and Treatment: Rehabilitation  ONSET DATE: injury 2 years ago  SUBJECTIVE:                                                                                                                                                                                      SUBJECTIVE STATEMENT: Pt with 3/10 pain and sore in posterior deltoid   EVAL-About 2 years ago I had an injury and I was removing ivy out of window boxes at my parents home and I feel it more in the back of scapula and lower neck. I am constantly aware of discomfort.  When I sleep on my Right shoulder I have tingling  in my shoulders and I avoid sleeping on my R shoulder. Routinely I am at [employer] and I walk for exercise.  Hand dominance: Right  PERTINENT HISTORY: Breast CA remission/healed more than 5 years, hysty  PAIN:  Are you having pain? Yes: NPRS scale: at rest  3/10 but if I move or wash a car 01/07/09 Pain location: R shoulder Pain description: achy occassionally sharp after it is inflamed Aggravating factors: sleeping on R shld, excessive use. Driving more than 2 hours in car, washing windows or my car Relieving factors: sometimes  medication but mostly nothing  PRECAUTIONS: None  RED FLAGS: None   WEIGHT BEARING RESTRICTIONS: No  FALLS:  Has patient fallen in last 6 months? No  LIVING ENVIRONMENT: Lives with: lives with their spouse Lives in: House/apartment Stairs: Yes: Internal: 0 steps; none Has following equipment at home: None  OCCUPATION: bookkeeper  PLOF: Independent  PATIENT GOALS:Stop pain around R shoulder /neck.  Not think about pain in shld.  NEXT MD VISIT:   OBJECTIVE:   DIAGNOSTIC FINDINGS:  EXAM: RIGHT SHOULDER - 2+ VIEW   COMPARISON:  None Available.   FINDINGS: There is a soft tissue calcification just lateral to the acromion. No other bony or soft tissue abnormalities are identified.   IMPRESSION: There is a nonspecific soft tissue calcification just lateral to the acromion. No other abnormalities. No cause for pain identified.     Electronically Signed   By: Gerome Sam III M.D.   On: 12/12/2022 15:43  PATIENT SURVEYS:  FOTO 57% predicted 70% 02-25-23  64%  COGNITION: Overall cognitive status: Within functional limits for tasks assessed     SENSATION: WFL  POSTURE: Slight forward head, rounded shld  and Right more forward than Left CERVICAL ROM:   Active ROM A/PROM (deg) eval AROM 02-25-23  Flexion    Extension    Right lateral flexion    Left lateral flexion    Right rotation 54 65  Left rotation 53 61   (Blank  rows = not tested)  UPPER EXTREMITY ROM:  all motion WNL but with pain at end range of IR and horizontal adduction  Active ROM Right eval Left eval R/L 02-25-23  Shoulder flexion 158 160   Shoulder extension     Shoulder abduction     Shoulder adduction   WNL without pain  Shoulder internal rotation     Shoulder external rotation     Elbow flexion     Elbow extension     Wrist flexion     Wrist extension     Wrist ulnar deviation     Wrist radial deviation     Wrist pronation     Wrist supination     (Blank rows = not tested)  UPPER EXTREMITY MMT:  MMT Right eval Left eval  Shoulder flexion 5 5  Shoulder extension 5 5  Shoulder abduction 4+ 5  Shoulder adduction    Shoulder internal rotation 5 5  Shoulder external rotation 4 5  Middle trapezius    Lower trapezius    Elbow flexion    Elbow extension    Wrist flexion    Wrist extension    Wrist ulnar deviation    Wrist radial deviation    Wrist pronation    Wrist supination    Grip strength (lbs) 57 46lb  (Blank rows = not tested)  SHOULDER SPECIAL TESTS: NT  JOINT MOBILITY TESTING:  WNL  PALPATION:  TTP over R upper trap/levator and over infraspinatus  Functional testing   5 x STS  7.26 sec  TODAY'S TREATMENT:   [facility] Adult PT Treatment:                                                DATE: *** Therapeutic Exercise: *** Manual Therapy: *** Neuromuscular re-ed: *** Therapeutic Activity: *** Modalities: *** Self Care: ***  [facility] Adult PT Treatment:  DATE: 02-27-23 Therapeutic Exercise: OH press with 25 # training bar 1 x 10 feet even, then 2x 10 feet staggered Bent over row with UE planted and Knee on mat for stability 3 x 10 on R and L each with 25 # KB Supine bench press with 10 # DB  3 x 10 Manual Therapy: STW to R deltoid  STW  over R UT/LS rhomboids, infraspinatus Thoracic ext mobs Gentle cervical distraction Trigger Point Dry-Needling  performed     by Garen Lah Treatment instructions: Expect mild to moderate muscle soreness. S/S of pneumothorax if dry needled over a lung field, and to seek immediate medical attention should they occur. Patient verbalized understanding of these instructions and education.  Patient Consent Given: Yes Education handout provided: Previously provided Muscles treated: UT/LS right. Subscapularis R. R rhomboid  Electrical stimulation performed: Yes Parameters: yes  25 pps  to pt tolerance and raised as pt able every 2 minutes for 10 min Treatment response/outcome: twitch response noted, pt noted relief Moist Hot Pack-  to thoracic and cervical  [facility] Adult PT Treatment:                                                DATE: 02-25-23 Prone IYT  Goals Therapeutic Exercise: OH press with 20 lb DB wt  2 x 10  25 # Gorilla row  3 x 8 Supine chest press with 9 # DB in each hand 2 x 10 Manual Therapy:  STW  over R UT/LS rhomboids, infraspinatus STW to posterior deltoid Thoracic ext mobs Gentle cervical distraction Trigger Point Dry-Needling performed     by Garen Lah Treatment instructions: Expect mild to moderate muscle soreness. S/S of pneumothorax if dry needled over a lung field, and to seek immediate medical attention should they occur. Patient verbalized understanding of these instructions and education.  Patient Consent Given: Yes Education handout provided: Previously provided Muscles treated: UT/LS right. Subscapularis R. R rhomboid  Electrical stimulation performed: Yes Parameters: yes  25 pps  to pt tolerance and raised as pt able every 2 minutes for 10 min Treatment response/outcome: twitch response noted, pt noted relief Moist Hot Pack-  to thoracic and cervical  Modalities: Moist hot pack   [facility] Adult PT Treatment:                                                DATE: 02-13-23 Therapeutic Exercise: 25 # lift training bar  OH press standing 1 x 10 then with legs  staggered 1x9 and 1 x 8 On physioball red DB bil prone I,y t  without weights 1 x 10  , 2 x 10 with 2 # DB On physioball  2 # DB bil in rows 2 x 10 Supine chest press with 9 # DB in each hand 2 x 10 OH  Arnold press with R and L sitting 1 x 15 each in chair to ground for pt petite size  1 x 15 Manual Therapy: STW  over needled muscle Gentle cervical distraction Thoracic ext mobs Trigger Point Dry-Needling performed     by Garen Lah Treatment instructions: Expect mild to moderate muscle soreness. S/S of pneumothorax if dry needled over a lung field, and  to seek immediate medical attention should they occur. Patient verbalized understanding of these instructions and education.  Patient Consent Given: Yes Education handout provided: Previously provided Muscles treated: UT/LS right. Subscapularis R. R rhomboid and ribs 5,6.7 Electrical stimulation performed: Yes Parameters: yes  25 pps  to pt tolerance and raised as pt able every 2 minutes for 11 min Treatment response/outcome: twitch response noted, pt noted relief Moist Hot Pack-  to thoracic and cervical  [facility] Adult PT Treatment:                                                DATE: 02-11-23 Therapeutic Exercise: On Pink foam roller,  supine scapular start pattern with GTB 25 # lift training bar  OH press standing 1 x 8 then with legs staggered 2 x 7 Manual Therapy: STW  over needled muscle Gentle cervical distraction Thoracic ext mobs UPA on R of C- 3 to C-6 Trigger Point Dry-Needling performed     by Garen Lah Treatment instructions: Expect mild to moderate muscle soreness. S/S of pneumothorax if dry needled over a lung field, and to seek immediate medical attention should they occur. Patient verbalized understanding of these instructions and education.  Patient Consent Given: Yes Education handout provided: Previously provided Muscles treated: UT/LS right. Subscapularis R. R rhomboid and ribs 5,6.7 Electrical  stimulation performed: No Parameters: N/A Treatment response/outcome: twitch response noted, pt noted relief  Moist hot pack Self Care: Pt given handouts and discussed posture and body mechanics and answered all questions Lifting principles                                                                                                                                        DATE: eval  and issue HEP Trigger Point Dry-Needling performed     by Garen Lah Treatment instructions: Expect mild to moderate muscle soreness. S/S of pneumothorax if dry needled over a lung field, and to seek immediate medical attention should they occur. Patient verbalized understanding of these instructions and education.  Patient Consent Given: Yes Education handout provided: Previously provided Muscles treated: UT/LS right Electrical stimulation performed: No Parameters: N/A Treatment response/outcome: twitch response noted, pt noted relief   PATIENT EDUCATION: Education details: POC, Explanation of findings  FOTO report and issue of HEP, TPDN education and handouts Person educated: Patient Education method: Explanation, Demonstration, Tactile cues, Verbal cues, and Handouts Education comprehension: verbalized understanding, returned demonstration, verbal cues required, tactile cues required, and needs further education  HOME EXERCISE PROGRAM: Access Code: [email or web address] URL: [email or web address] Date: 02/04/2023 Prepared by: Garen Lah  Program Notes Use towel over R upper trap for added stability with stretch  Exercises - Seated Gentle Upper Trapezius Stretch  - 2 x daily - 7 x weekly - 1  sets - 3 reps - 20-20 sec hold - Gentle Levator Scapulae Stretch  - 2 x daily - 7 x weekly - 1 sets - 3 reps - 20-30 hold - Shoulder External Rotation and Scapular Retraction with Resistance  - 1-2 x daily - 7 x weekly - 3 sets - 10 reps - 3-5 sec hold - Shoulder extension with resistance -  Neutral  - 1 x daily - 7 x weekly - 3 sets - 10 reps - Standing Shoulder Horizontal Abduction with Resistance  - 1 x daily - 7 x weekly - 3 sets - 10 reps - Standing Shoulder Diagonal Horizontal Abduction 60/120 Degrees with Resistance  - 1 x daily - 7 x weekly - 3 sets - 10 reps  ASSESSMENT:  CLINICAL IMPRESSION:  Inetta Fermo enters clinic with 3/10 pain today in R shoulder and posterior deltoid. Pt is able to continue with exercise and added weight to [facility] press.   Pt reports good compliance with HEP at home and is mostly compliant with HEP. Marland Kitchen  Pt  consents to TPDN today and is closely monitored throughout session. With e stim.  Will continue toward progression and achievement of goals.  Pt with good sustained twitching of muscle with estim  Pt left with all questions answered from PT perspective and no adverse effects.     EVAL-  Patient is a 57 y.o. female who was seen today for physical therapy evaluation and treatment for chronic R shld pain from an injury pulling ivy at her parents home 2 years ago. She has had ongoing discomfort but notices more pain now when she exerts force through her arm while washing her car or washing windows  She also notes pain when she drives for longer than 2 hours in car.  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 Pt is proactive with health and walks and belongs to a gym. She would like to alleviate pain so she can continue to improve her overall health and strength.  Pt  will benefit from skilled PT to maximize her AROM/strength and eliminate her pain for functional activities  OBJECTIVE IMPAIRMENTS: decreased activity tolerance, decreased strength, impaired UE functional use, improper body mechanics, postural dysfunction, and pain.   ACTIVITY LIMITATIONS: reach over head and washing car/windows/yardwork  PARTICIPATION LIMITATIONS: driving and community activity  PERSONAL FACTORS: 1  comorbidity: Breast CA in remission  are also affecting patient's functional outcome.   REHAB POTENTIAL: Excellent  CLINICAL DECISION MAKING: Evolving/moderate complexity  EVALUATION COMPLEXITY: Moderate   GOALS: Goals reviewed with patient? Yes  SHORT TERM GOALS: Target date: 02-06-23  Pt will be I with initial HEP specific impairment Baseline: routinely goes to [employer] Goal status: MET  2.  When utilizing R UE pain will decrease to at least 5/10  Baseline: 7/10 02-06-23 3-4/10 Goal status: MET  3.  Demonstrate understanding of proper sitting posture and be more conscious of position and posture throughout the day.  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  Office ergonomic education to prevent flexed posture on computer Baseline: works as bookkeeper 02-11-23  given handout Goal status: MET    LONG TERM GOALS: Target date: 02-27-23  Pt will be I with advanced HEP Baseline: limited knowledge Goal status:ONGOING  2.  Pt will be able to wash car or windows without exacerbating pain greater  than 2/10 Baseline: Pt  7/10  02-25-23  3/10 Goal status: ONGOING  3.  Pt will be able to drive and scan environment without exacerbating pain greater 2/10 Baseline: increases with 2 hours driving 0-10-27  short term driving good but longer trips greater than 2 hours difficult Goal status:ONGOING  4.  FOTO will improve from  57%  to  70%   indicating improved functional mobility.  Baseline: 57% eval 02-25-23  64% Goal status:ONGOING  5.  Pt will demonstrate increased UE strength by placing 20 lb weight above head without compensations Baseline: able to lift 15 lb but with compensation and pain 02-25-23 able to lift above head but compensations to place on shelf Goal status: ONGOING  6.  Pt will be able to sleep on Left side and not wake due to pain for 5 or more restorative hours of sleep at  night Baseline: avoids sleeping on Right side due to pain 02-25-23 Rarely will wake up but wakes up with shoulder sore Goal status: Partially Met  PLAN:  PT FREQUENCY: 1-2x/week extended 1 x a week   PT DURATION: 6 weeks extended 4 weeks to complete RX  PLANNED INTERVENTIONS: Therapeutic exercises, Therapeutic activity, Neuromuscular re-education, Balance training, Gait training, Patient/Family education, Self Care, Joint mobilization, Dry Needling, Electrical stimulation, Spinal mobilization, Cryotherapy, Moist heat, Taping, Ionotophoresis 4mg /ml Dexamethasone, Manual therapy, and Re-evaluation  PLAN FOR NEXT SESSION: HEP and TPDN/manual  ***

## 2023-03-11 ENCOUNTER — Encounter: Payer: Self-pay | Admitting: Physical Therapy

## 2023-03-11 ENCOUNTER — Ambulatory Visit: Payer: 59 | Attending: Sports Medicine | Admitting: Physical Therapy

## 2023-03-11 DIAGNOSIS — M25511 Pain in right shoulder: Secondary | ICD-10-CM | POA: Diagnosis present

## 2023-03-11 DIAGNOSIS — M542 Cervicalgia: Secondary | ICD-10-CM | POA: Insufficient documentation

## 2023-03-11 DIAGNOSIS — M6281 Muscle weakness (generalized): Secondary | ICD-10-CM | POA: Diagnosis present

## 2023-03-11 DIAGNOSIS — R293 Abnormal posture: Secondary | ICD-10-CM | POA: Insufficient documentation

## 2023-03-11 DIAGNOSIS — G8929 Other chronic pain: Secondary | ICD-10-CM | POA: Insufficient documentation

## 2023-03-11 NOTE — Therapy (Signed)
OUTPATIENT PHYSICAL THERAPY SHOULDER TREATMENT   Patient Name: Evelyn Castro MRN: [id number] DOB:1966-01-07, 57 y.o., female Today's Date: 03/18/2023  END OF SESSION:  PT End of Session - 03/18/23 1012     Visit Number 9    Number of Visits 12    Date for PT Re-Evaluation 03/25/23    Authorization Type [employer]    PT Start Time 1015    PT Stop Time 1100    PT Time Calculation (min) 45 min    Activity Tolerance Patient tolerated treatment well    Behavior During Therapy [facility] for tasks assessed/performed                     Past Medical History:  Diagnosis Date   AGUS favor benign 12/2015   Review by outside pathologist feel glandular cells were benign with no atypia. Colposcopy was negative. Recommended follow up Pap smear one year.   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   Varicose veins     PCP: NO PCP  REFERRING PROVIDER:  Richardean Sale, DO   REFERRING DIAG: M25.511,G89.29 (ICD-10-CM) - Chronic right shoulder pain   THERAPY DIAG:  Chronic right shoulder pain  Cervicalgia  Muscle weakness (generalized)  Abnormal posture  Rationale for Evaluation and Treatment: Rehabilitation  ONSET DATE: injury 2 years ago  SUBJECTIVE:                                                                                                                                                                                       SUBJECTIVE STATEMENT: 03-18-23  My pain does ot wake me up now but I still wake up with soreness in the mornings   EVAL-About 2 years ago I had an injury and I was removing ivy out of window boxes at my parents home and I feel it more in the back of scapula and lower neck. I am constantly  aware of discomfort.  When I sleep on my Right shoulder I have tingling in my shoulders and I avoid sleeping on my R shoulder. Routinely I am at [employer] and I walk for exercise.  Hand dominance: Right  PERTINENT HISTORY: Breast CA remission/healed more than 5 years, hysty  PAIN:  Are you having pain? Yes: NPRS scale: at rest  3/10 but if I move or wash a car 01/07/09 Pain location: R shoulder Pain description: achy occassionally sharp after it is inflamed Aggravating factors: sleeping on R shld, excessive use. Driving more than 2 hours in car, washing windows or my car Relieving factors: sometimes medication but mostly nothing  PRECAUTIONS: None  RED FLAGS: None   WEIGHT BEARING RESTRICTIONS: No  FALLS:  Has patient fallen in last 6 months? No  LIVING ENVIRONMENT: Lives with: lives with their spouse Lives in: House/apartment Stairs: Yes: Internal: 0 steps; none Has following equipment at home: None  OCCUPATION: bookkeeper  PLOF: Independent  PATIENT GOALS:Stop pain around R shoulder /neck.  Not think about pain in shld.  NEXT MD VISIT:   OBJECTIVE:   DIAGNOSTIC FINDINGS:  EXAM: RIGHT SHOULDER - 2+ VIEW   COMPARISON:  None Available.   FINDINGS: There is a soft tissue calcification just lateral to the acromion. No other bony or soft tissue abnormalities are identified.   IMPRESSION: There is a nonspecific soft tissue calcification just lateral to the acromion. No other abnormalities. No cause for pain identified.     Electronically Signed   By: Gerome Sam III M.D.   On: 12/12/2022 15:43  PATIENT SURVEYS:  FOTO 57% predicted 70% 02-25-23  64%  COGNITION: Overall cognitive status: Within functional limits for tasks assessed     SENSATION: WFL  POSTURE: Slight forward head, rounded shld  and Right more forward than Left CERVICAL ROM:   Active ROM A/PROM (deg) eval AROM 02-25-23 AROM 03-11-23  Flexion     Extension     Right lateral flexion     Left lateral flexion     Right rotation 54 65 65  Left rotation 53 61 62   (Blank rows = not tested)  UPPER EXTREMITY ROM:  all motion WNL but with pain at end range of IR and horizontal adduction  Active ROM Right eval Left eval R/L 02-25-23  Shoulder flexion 158 160   Shoulder extension     Shoulder abduction     Shoulder adduction   WNL without pain  Shoulder internal rotation     Shoulder external rotation     Elbow flexion     Elbow extension     Wrist flexion     Wrist extension     Wrist ulnar deviation     Wrist radial deviation     Wrist pronation     Wrist supination     (Blank rows = not tested)  UPPER EXTREMITY MMT:  MMT Right eval Left eval  Shoulder flexion 5 5  Shoulder extension 5 5  Shoulder abduction 4+ 5  Shoulder adduction    Shoulder internal rotation 5 5  Shoulder external rotation 4 5  Middle trapezius    Lower trapezius    Elbow flexion    Elbow extension    Wrist flexion    Wrist extension    Wrist ulnar deviation    Wrist radial deviation    Wrist pronation    Wrist supination    Grip strength (lbs) 57 46lb  (Blank rows = not  tested)  SHOULDER SPECIAL TESTS: NT  JOINT MOBILITY TESTING:  WNL  PALPATION:  TTP over R upper trap/levator and over infraspinatus  Functional testing   5 x STS  7.26 sec  TODAY'S TREATMENT:  [facility] Adult PT Treatment:                                                DATE: 03-18-23 Therapeutic Exercise: Prone Shoulder flexion/Scaption on Swiss ball with 3 # DB  3 sets - 10 reps Prone Middle  Trapezius Strengthening on Swiss Ball with 3 # DB 3 sets - 10 reps Prone Shoulder Extension on Swiss Ball with 3 # DB 3 sets - 10 reps Prone Shoulder W on [employer]  with 3 # DB 3 sets - 10 reps Supine abdominal bracing using 55 cm physio ball 15 x Manual Therapy: STW  over R UT/LS rhomboids, infraspinatus Thoracic ext mobs Gentle cervical distraction Trigger Point Dry-Needling performed     by Garen Lah Treatment instructions: Expect mild to moderate muscle soreness. S/S of pneumothorax if dry needled over a lung field, and to seek immediate medical attention should they occur. Patient verbalized understanding of these instructions and education.  Patient Consent Given: Yes Education handout provided: Previously provided Muscles treated:  Right pec mx UT/LS right. infraspinatus R.  Electrical stimulation performed: Yes Parameters: yes  25 pps  to pt tolerance and raised as pt able every 2 minutes for 10 min Treatment response/outcome: twitch response noted, pt noted relief SELF-CARE-  Pt given positioning help using towels for adding space at night to avoid compression of shoulder. Pain management techniques for nighttime help  [facility] Adult PT Treatment:                                                DATE: 03-11-23 Therapeutic Exercise: Free motion machine chopping 27 lb 2 x 10  Free motion machine lifting 20 lb 1  x 10 on  L and then  on R13 lb 1  x 10 Freemotion bil tricep extension 20 lb 2 x 10 Free motion  rows 3 x 10  20 lb 15  lb KB OH press SL  2 x 10 on R and L Manual Therapy: STW  over R UT/LS rhomboids, infraspinatus Thoracic ext mobs Gentle cervical distraction Trigger Point Dry-Needling performed     by Garen Lah Treatment instructions: Expect mild to moderate muscle soreness. S/S of pneumothorax if dry needled over a lung field, and to seek immediate medical attention should they occur. Patient verbalized understanding of these instructions and  education.  Patient Consent Given: Yes Education handout provided: Previously provided Muscles treated: UT/LS right. infraspinatus R. R rhomboid  Electrical stimulation performed: Yes Parameters: yes  25 pps  to pt tolerance and raised as pt able every 2 minutes for 10 min Treatment response/outcome: twitch response noted, pt noted relief  [facility] Adult PT Treatment:                                                DATE: 02-27-23 Therapeutic Exercise: OH press with  25 # training bar 1 x 10 feet even, then 2x 10 feet staggered Bent over row with UE planted and Knee on mat for stability 3 x 10 on R and L each with 25 # KB Supine bench press with 10 # DB  3 x 10 Manual Therapy: STW to R deltoid  STW  over R UT/LS rhomboids, infraspinatus Thoracic ext mobs Gentle cervical distraction Trigger Point Dry-Needling performed     by Garen Lah Treatment instructions: Expect mild to moderate muscle soreness. S/S of pneumothorax if dry needled over a lung field, and to seek immediate medical attention should they occur. Patient verbalized understanding of these instructions and education.  Patient Consent Given: Yes Education handout provided: Previously provided Muscles treated: UT/LS right. Subscapularis R. R rhomboid  Electrical stimulation performed: Yes Parameters: yes  25 pps  to pt tolerance and raised as pt able every 2 minutes for 10 min Treatment response/outcome: twitch response noted, pt noted relief Moist Hot Pack-  to thoracic and cervical  [facility] Adult PT Treatment:                                                DATE: 02-25-23 Prone IYT  Goals Therapeutic Exercise: OH press with 20 lb DB wt  2 x 10  25 # Gorilla row  3 x 8 Supine chest press with 9 # DB in each hand 2 x 10 Manual Therapy:  STW  over R UT/LS rhomboids, infraspinatus STW to posterior deltoid Thoracic ext mobs Gentle cervical distraction Trigger Point Dry-Needling performed     by Garen Lah Treatment  instructions: Expect mild to moderate muscle soreness. S/S of pneumothorax if dry needled over a lung field, and to seek immediate medical attention should they occur. Patient verbalized understanding of these instructions and education.  Patient Consent Given: Yes Education handout provided: Previously provided Muscles treated: UT/LS right. Subscapularis R. R rhomboid  Electrical stimulation performed: Yes Parameters: yes  25 pps  to pt tolerance and raised as pt able every 2 minutes for 10 min Treatment response/outcome: twitch response noted, pt noted relief Moist Hot Pack-  to thoracic and cervical  Modalities: Moist hot pack   [facility] Adult PT Treatment:                                                DATE: 02-13-23 Therapeutic Exercise: 25 # lift training bar  OH press standing 1 x 10 then with legs staggered 1x9 and 1 x 8 On physioball red DB bil prone I,y t  without weights 1 x 10  , 2 x 10 with 2 # DB On physioball  2 # DB bil in rows 2 x 10 Supine chest press with 9 # DB in each hand 2 x 10 OH  Arnold press with R and L sitting 1 x 15 each in chair to ground for pt petite size  1 x 15 Manual Therapy: STW  over needled muscle Gentle cervical distraction Thoracic ext mobs Trigger Point Dry-Needling performed     by Garen Lah Treatment instructions: Expect mild to moderate muscle soreness. S/S of pneumothorax if dry needled over a lung field, and to seek immediate medical attention should they occur.  Patient verbalized understanding of these instructions and education.  Patient Consent Given: Yes Education handout provided: Previously provided Muscles treated: UT/LS right. Subscapularis R. R rhomboid and ribs 5,6.7 Electrical stimulation performed: Yes Parameters: yes  25 pps  to pt tolerance and raised as pt able every 2 minutes for 11 min Treatment response/outcome: twitch response noted, pt noted relief Moist Hot Pack-  to thoracic and cervical  [facility] Adult PT  Treatment:                                                DATE: 02-11-23 Therapeutic Exercise: On Pink foam roller,  supine scapular start pattern with GTB 25 # lift training bar  OH press standing 1 x 8 then with legs staggered 2 x 7 Manual Therapy: STW  over needled muscle Gentle cervical distraction Thoracic ext mobs UPA on R of C- 3 to C-6 Trigger Point Dry-Needling performed     by Garen Lah Treatment instructions: Expect mild to moderate muscle soreness. S/S of pneumothorax if dry needled over a lung field, and to seek immediate medical attention should they occur. Patient verbalized understanding of these instructions and education.  Patient Consent Given: Yes Education handout provided: Previously provided Muscles treated: UT/LS right. Subscapularis R. R rhomboid and ribs 5,6.7 Electrical stimulation performed: No Parameters: N/A Treatment response/outcome: twitch response noted, pt noted relief  Moist hot pack Self Care: Pt given handouts and discussed posture and body mechanics and answered all questions Lifting principles                                                                                                                                        DATE: eval  and issue HEP Trigger Point Dry-Needling performed     by Garen Lah Treatment instructions: Expect mild to moderate muscle soreness. S/S of pneumothorax if dry needled over a lung field, and to seek immediate medical attention should they occur. Patient verbalized understanding of these instructions and education.  Patient Consent Given: Yes Education handout provided: Previously provided Muscles treated: UT/LS right Electrical stimulation performed: No Parameters: N/A Treatment response/outcome: twitch response noted, pt noted relief   PATIENT EDUCATION: Education details: POC, Explanation of findings  FOTO report and issue of HEP, TPDN education and handouts Person educated:  Patient Education method: Explanation, Demonstration, Tactile cues, Verbal cues, and Handouts Education comprehension: verbalized understanding, returned demonstration, verbal cues required, tactile cues required, and needs further education  HOME EXERCISE PROGRAM: Access Code: [email or web address] URL: [email or web address] Date: 02/04/2023 Prepared by: Garen Lah  Program Notes Use towel over R upper trap for added stability with stretch  Exercises - Seated Gentle Upper Trapezius Stretch  - 2 x daily - 7 x weekly - 1 sets - 3 reps - 20-20 sec  hold - Gentle Levator Scapulae Stretch  - 2 x daily - 7 x weekly - 1 sets - 3 reps - 20-30 hold - Shoulder External Rotation and Scapular Retraction with Resistance  - 1-2 x daily - 7 x weekly - 3 sets - 10 reps - 3-5 sec hold - Shoulder extension with resistance - Neutral  - 1 x daily - 7 x weekly - 3 sets - 10 reps - Standing Shoulder Horizontal Abduction with Resistance  - 1 x daily - 7 x weekly - 3 sets - 10 reps - Standing Shoulder Diagonal Horizontal Abduction 60/120 Degrees with Resistance  - 1 x daily - 7 x weekly - 3 sets - 10 reps Added 03-18-23 - Prone Shoulder flexion/Scaption on Swiss ball with Dumbbell  - 1 x daily - 7 x weekly - 3 sets - 10 reps - Prone Middle Trapezius Strengthening on Swiss Ball with Dumbbells  - 1 x daily - 7 x weekly - 3 sets - 10 reps - Prone Shoulder Extension on Swiss Ball with Dumbbells  - 1 x daily - 7 x weekly - 3 sets - 10 reps - Prone Shoulder W on [employer]  - 1 x daily - 7 x weekly - 3 sets - 10 reps  ASSESSMENT:  CLINICAL IMPRESSION: Inetta Fermo enters clinic with 3/10 and still complains of morning pain/soreness.  Pt given positioning and pain management helps for night time relief of pain.   Pt stated she was able to roll over on R shoulder without waking and feels more improvement.  Pt achieved # 3 LTG.  Pt reports good compliance with HEP at home and is mostly compliant with HEP. Marland Kitchen  Pt  consents  to TPDN today and is closely monitored throughout session. With e stim.  Will continue toward progression and achievement of goals.  Pt with good sustained twitching of muscle with estim  Pt left with all questions answered from PT perspective and no adverse effects. Pt should be ready for DC next visit. Do FOTO     EVAL-  Patient is a 57 y.o. female who was seen today for physical therapy evaluation and treatment for chronic R shld pain from an injury pulling ivy at her parents home 2 years ago. She has had ongoing discomfort but notices more pain now when she exerts force through her arm while washing her car or washing windows  She also notes pain when she drives for longer than 2 hours in car.  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 Pt is proactive with health and walks and belongs to a gym. She would like to alleviate pain so she can continue to improve her overall health and strength.  Pt  will benefit from skilled PT to maximize her AROM/strength and eliminate her pain for functional activities  OBJECTIVE IMPAIRMENTS: decreased activity tolerance, decreased strength, impaired UE functional use, improper body mechanics, postural dysfunction, and pain.   ACTIVITY LIMITATIONS: reach over head and washing car/windows/yardwork  PARTICIPATION LIMITATIONS: driving and community activity  PERSONAL FACTORS: 1 comorbidity: Breast CA in remission  are also affecting patient's functional outcome.   REHAB POTENTIAL: Excellent  CLINICAL DECISION MAKING: Evolving/moderate complexity  EVALUATION COMPLEXITY: Moderate   GOALS: Goals reviewed with patient? Yes  SHORT TERM GOALS: Target date: 02-06-23  Pt will be I with initial HEP specific impairment Baseline: routinely goes to [employer] Goal status: MET  2.  When utilizing R UE pain will decrease to at least 5/10  Baseline: 7/10 02-06-23 3-4/10 Goal status:  MET  3.  Demonstrate understanding of proper sitting posture and be more conscious of position and posture throughout the day.  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  Office ergonomic education to prevent flexed posture on computer Baseline: works as bookkeeper 02-11-23  given handout Goal status: MET    LONG TERM GOALS: Target date: 02-27-23  Revised 03-25-23  Pt will be I with advanced HEP Baseline: limited knowledge Goal status:ONGOING  2.  Pt will be able to wash car or windows without exacerbating pain greater than 2/10 Baseline: Pt  7/10  02-25-23  3/10 03-18-23  3/10 in morning down  to 0/10 at times Goal status: ONGOING  3.  Pt will be able to drive and scan environment without exacerbating pain greater 2/10 Baseline: increases with 2 hours driving [id number]  short term driving good but longer trips greater than 2 hours difficult 03-18-23   Pain in morning but not driving. Able to drive to [facility]  3 hours Goal status:MET  4.  FOTO will improve from  57%  to  70%   indicating improved functional mobility.  Baseline: 57% eval 02-25-23  64% Goal status:ONGOING  5.  Pt will demonstrate increased UE strength by placing 20 lb weight above head without compensations Baseline: able to lift 15 lb but with compensation and pain 02-25-23 able to lift above head but compensations to place on shelf  Goal status: ONGOING  6.  Pt will be able to sleep on Left side and not wake due to pain for 5 or more restorative hours of sleep at night Baseline: avoids sleeping on Right side due to pain 02-25-23 Rarely will wake up but wakes up with shoulder sore Goal status: Partially Met  PLAN:  PT FREQUENCY: 1-2x/week extended 1 x a week   PT DURATION: 6 weeks extended 4 weeks to complete RX  PLANNED INTERVENTIONS: Therapeutic exercises, Therapeutic activity, Neuromuscular re-education, Balance training, Gait  training, Patient/Family education, Self Care, Joint mobilization, Dry Needling, Electrical stimulation, Spinal mobilization, Cryotherapy, Moist heat, Taping, Ionotophoresis 4mg /ml Dexamethasone, Manual therapy, and Re-evaluation  PLAN FOR NEXT SESSION: HEP and TPDN/manual  Garen Lah, PT, ATRIC Certified Exercise Expert for the Aging Adult  03/18/23 11:02 AM Phone: [phone] Fax: [phone]

## 2023-03-18 ENCOUNTER — Ambulatory Visit: Payer: 59 | Admitting: Physical Therapy

## 2023-03-18 ENCOUNTER — Encounter: Payer: Self-pay | Admitting: Physical Therapy

## 2023-03-18 DIAGNOSIS — M6281 Muscle weakness (generalized): Secondary | ICD-10-CM

## 2023-03-18 DIAGNOSIS — R293 Abnormal posture: Secondary | ICD-10-CM

## 2023-03-18 DIAGNOSIS — M542 Cervicalgia: Secondary | ICD-10-CM

## 2023-03-18 DIAGNOSIS — M25511 Pain in right shoulder: Secondary | ICD-10-CM | POA: Diagnosis not present

## 2023-03-18 DIAGNOSIS — G8929 Other chronic pain: Secondary | ICD-10-CM

## 2023-03-19 NOTE — Therapy (Incomplete)
OUTPATIENT PHYSICAL THERAPY SHOULDER TREATMENT/DISCHARGE NOTE PHYSICAL THERAPY DISCHARGE SUMMARY  Visits from Start of Care: 10  Current functional level related to goals / functional outcomes: As indicated below   Remaining deficits: No deficits  only sore on rising in morning but improves with exercise  Does not disturb sleep   Education / Equipment: HEP and Community resources   Patient agrees to discharge. Patient goals were met. Patient is being discharged due to meeting the stated rehab goals.  And being pleased with current level of function   Patient Name: Evelyn Castro MRN: [id number] DOB:09/17/1965, 57 y.o., female Today's Date: 03/20/2023  END OF SESSION:  PT End of Session - 03/20/23 1021     Visit Number 10    Number of Visits 12    Date for PT Re-Evaluation 03/25/23    Authorization Type [employer]    PT Start Time 1018    PT Stop Time 1100    PT Time Calculation (min) 42 min    Activity Tolerance Patient tolerated treatment well    Behavior During Therapy WFL for tasks assessed/performed                      Past Medical History:  Diagnosis Date   AGUS favor benign 12/2015   Review by outside pathologist feel glandular cells were benign with no atypia. Colposcopy was negative. Recommended follow up Pap smear one year.   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    Multiple allergies    Varicose veins    Past Surgical History:  Procedure Laterality Date   ABDOMINAL HYSTERECTOMY  02/25/2011   Procedure: HYSTERECTOMY ABDOMINAL;  Surgeon: Trellis Paganini, MD;  Location: [facility];  Service: Gynecology;  Laterality: N/A;   BREAST BIOPSY Right 11/12/2012   Procedure: RIGHT BREAST WIRE LOCALIZED EXCISION ;  Surgeon: Emelia Loron, MD;  Location: [facility];  Service: General;  Laterality: Right;   BREAST SURGERY     Lumpectomy   VARICOSE VEINS  2000, 2001   Patient Active Problem List    Diagnosis Date Noted   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   Varicose veins     PCP: NO PCP  REFERRING PROVIDER:  Richardean Sale, DO   REFERRING DIAG: M25.511,G89.29 (ICD-10-CM) - Chronic right shoulder pain   THERAPY DIAG:  Chronic right shoulder pain  Cervicalgia  Muscle weakness (generalized)  Abnormal posture  Rationale for Evaluation and Treatment: Rehabilitation  ONSET DATE: injury 2 years ago  SUBJECTIVE:  SUBJECTIVE STATEMENT: 03-18-23  My pain does ot wake me up now but I still wake up with soreness in the mornings   EVAL-About 2 years ago I had an injury and I was removing ivy out of window boxes at my parents home and I feel it more in the back of scapula and lower neck. I am constantly aware of discomfort.  When I sleep on my Right shoulder I have tingling in my shoulders and I avoid sleeping on my R shoulder. Routinely I am at [employer] and I walk for exercise.  Hand dominance: Right  PERTINENT HISTORY: Breast CA remission/healed more than 5 years, hysty  PAIN:  Are you having pain? Yes: NPRS scale: at rest  3/10 but if I move or wash a car 01/07/09 Pain location: R shoulder Pain description: achy occassionally sharp after it is inflamed Aggravating factors: sleeping on R shld, excessive use. Driving more than 2 hours in car, washing windows or my car Relieving factors: sometimes medication but mostly nothing  PRECAUTIONS: None  RED FLAGS: None   WEIGHT BEARING RESTRICTIONS: No  FALLS:  Has patient fallen in last 6 months? No  LIVING ENVIRONMENT: Lives with: lives with their spouse Lives in: House/apartment Stairs: Yes: Internal: 0 steps; none Has following equipment at home: None  OCCUPATION: bookkeeper  PLOF: Independent  PATIENT  GOALS:Stop pain around R shoulder /neck.  Not think about pain in shld.  NEXT MD VISIT:   OBJECTIVE:   DIAGNOSTIC FINDINGS:  EXAM: RIGHT SHOULDER - 2+ VIEW   COMPARISON:  None Available.   FINDINGS: There is a soft tissue calcification just lateral to the acromion. No other bony or soft tissue abnormalities are identified.   IMPRESSION: There is a nonspecific soft tissue calcification just lateral to the acromion. No other abnormalities. No cause for pain identified.     Electronically Signed   By: Gerome Sam III M.D.   On: 12/12/2022 15:43  PATIENT SURVEYS:  FOTO 57% predicted 70% 02-25-23  64% 03-20-23 78%  COGNITION: Overall cognitive status: Within functional limits for tasks assessed     SENSATION: WFL  POSTURE: Slight forward head, rounded shld  and Right more forward than Left CERVICAL ROM:   Active ROM A/PROM (deg) eval AROM 02-25-23 AROM 03-11-23 AROM 03-20-23  Flexion      Extension      Right lateral flexion      Left lateral flexion      Right rotation 54 65 65 65  Left rotation 53 61 62 63   (Blank rows = not tested)  UPPER EXTREMITY ROM:  all motion WNL but with pain at end range of IR and horizontal adduction  Active ROM Right eval Left eval R/L 02-25-23 R/L 03-19-24  Shoulder flexion 158 160  160/160  Shoulder extension      Shoulder abduction      Shoulder adduction   WNL without pain WNL without pain  Shoulder internal rotation      Shoulder external rotation      Elbow flexion      Elbow extension      Wrist flexion      Wrist extension      Wrist ulnar deviation      Wrist radial deviation      Wrist pronation      Wrist supination      (Blank rows = not tested)  UPPER EXTREMITY MMT:  MMT Right eval Left eval R/L 03-20-23  Shoulder flexion 5  5 5/5  Shoulder extension 5 5 5/5  Shoulder abduction 4+ 5 5/5  Shoulder adduction     Shoulder internal rotation 5 5 5/5  Shoulder external rotation 4 5 5/5  Middle trapezius      Lower trapezius     Elbow flexion     Elbow extension     Wrist flexion     Wrist extension     Wrist ulnar deviation     Wrist radial deviation     Wrist pronation     Wrist supination     Grip strength (lbs) 57 46lb 60/55  (Blank rows = not tested)  SHOULDER SPECIAL TESTS: NT  JOINT MOBILITY TESTING:  WNL  PALPATION:  TTP over R upper trap/levator and over infraspinatus  Functional testing   5 x STS  7.26 sec  TODAY'S TREATMENT:  [facility] Adult PT Treatment:                                                DATE: 03-20-23 Manual Therapy: STW  over R UT/LS rhomboids, infraspinatus Thoracic ext mobs Gentle cervical distraction Trigger Point Dry-Needling performed     by Garen Lah Treatment instructions: Expect mild to moderate muscle soreness. S/S of pneumothorax if dry needled over a lung field, and to seek immediate medical attention should they occur. Patient verbalized understanding of these instructions and education.  Patient Consent Given: Yes Education handout provided: Previously provided Muscles treated:  Right pec mx UT/LS right. infraspinatus R.  Electrical stimulation performed: Yes Parameters: yes  25 pps  to pt tolerance and raised as pt able every 2 minutes for 12 min Treatment response/outcome: twitch response noted, pt noted relief SELF-CARE-   community education on resources for health and fitness post DC  [facility] Adult PT Treatment:                                                DATE: 03-18-23 Therapeutic Exercise: Prone Shoulder flexion/Scaption on Swiss ball with 3 # DB  3 sets - 10 reps Prone Middle Trapezius Strengthening on Swiss Ball with 3 # DB 3 sets - 10 reps Prone Shoulder Extension on Swiss Ball with 3 # DB 3 sets - 10 reps Prone Shoulder W on [employer]  with 3 # DB 3 sets - 10 reps Supine abdominal bracing using 55 cm physio ball 15 x Manual Therapy: STW  over R UT/LS rhomboids, infraspinatus Thoracic ext mobs Gentle cervical  distraction Trigger Point Dry-Needling performed     by Garen Lah Treatment instructions: Expect mild to moderate muscle soreness. S/S of pneumothorax if dry needled over a lung field, and to seek immediate medical attention should they occur. Patient verbalized understanding of these instructions and education.  Patient Consent Given: Yes Education handout provided: Previously provided Muscles treated:  Right pec mx UT/LS right. infraspinatus R.  Electrical stimulation performed: Yes Parameters: yes  25 pps  to pt tolerance and raised as pt able every 2 minutes for 10 min Treatment response/outcome: twitch response noted, pt noted relief SELF-CARE-  Pt given positioning help using towels for adding space at night to avoid compression of shoulder. Pain management techniques for nighttime help  [facility] Adult PT Treatment:  DATE: 03-11-23 Therapeutic Exercise: Free motion machine chopping 27 lb 2 x 10  Free motion machine lifting 20 lb 1  x 10 on  L and then  on R13 lb 1  x 10 Freemotion bil tricep extension 20 lb 2 x 10 Free motion  rows 3 x 10  20 lb 15  lb KB OH press SL  2 x 10 on R and L Manual Therapy: STW  over R UT/LS rhomboids, infraspinatus Thoracic ext mobs Gentle cervical distraction Trigger Point Dry-Needling performed     by Garen Lah Treatment instructions: Expect mild to moderate muscle soreness. S/S of pneumothorax if dry needled over a lung field, and to seek immediate medical attention should they occur. Patient verbalized understanding of these instructions and education.  Patient Consent Given: Yes Education handout provided: Previously provided Muscles treated: UT/LS right. infraspinatus R. R rhomboid  Electrical stimulation performed: Yes Parameters: yes  25 pps  to pt tolerance and raised as pt able every 2 minutes for 10 min Treatment response/outcome: twitch response noted, pt noted relief  [facility] Adult PT  Treatment:                                                DATE: 02-27-23 Therapeutic Exercise: OH press with 25 # training bar 1 x 10 feet even, then 2x 10 feet staggered Bent over row with UE planted and Knee on mat for stability 3 x 10 on R and L each with 25 # KB Supine bench press with 10 # DB  3 x 10 Manual Therapy: STW to R deltoid  STW  over R UT/LS rhomboids, infraspinatus Thoracic ext mobs Gentle cervical distraction Trigger Point Dry-Needling performed     by Garen Lah Treatment instructions: Expect mild to moderate muscle soreness. S/S of pneumothorax if dry needled over a lung field, and to seek immediate medical attention should they occur. Patient verbalized understanding of these instructions and education.  Patient Consent Given: Yes Education handout provided: Previously provided Muscles treated: UT/LS right. Subscapularis R. R rhomboid  Electrical stimulation performed: Yes Parameters: yes  25 pps  to pt tolerance and raised as pt able every 2 minutes for 10 min Treatment response/outcome: twitch response noted, pt noted relief Moist Hot Pack-  to thoracic and cervical  [facility] Adult PT Treatment:                                                DATE: 02-25-23 Prone IYT  Goals Therapeutic Exercise: OH press with 20 lb DB wt  2 x 10  25 # Gorilla row  3 x 8 Supine chest press with 9 # DB in each hand 2 x 10 Manual Therapy:  STW  over R UT/LS rhomboids, infraspinatus STW to posterior deltoid Thoracic ext mobs Gentle cervical distraction Trigger Point Dry-Needling performed     by Garen Lah Treatment instructions: Expect mild to moderate muscle soreness. S/S of pneumothorax if dry needled over a lung field, and to seek immediate medical attention should they occur. Patient verbalized understanding of these instructions and education.  Patient Consent Given: Yes Education handout provided: Previously provided Muscles treated: UT/LS right. Subscapularis R. R  rhomboid  Electrical stimulation performed: Yes  Parameters: yes  25 pps  to pt tolerance and raised as pt able every 2 minutes for 10 min Treatment response/outcome: twitch response noted, pt noted relief Moist Hot Pack-  to thoracic and cervical  Modalities: Moist hot pack   [facility] Adult PT Treatment:                                                DATE: 02-13-23 Therapeutic Exercise: 25 # lift training bar  OH press standing 1 x 10 then with legs staggered 1x9 and 1 x 8 On physioball red DB bil prone I,y t  without weights 1 x 10  , 2 x 10 with 2 # DB On physioball  2 # DB bil in rows 2 x 10 Supine chest press with 9 # DB in each hand 2 x 10 OH  Arnold press with R and L sitting 1 x 15 each in chair to ground for pt petite size  1 x 15 Manual Therapy: STW  over needled muscle Gentle cervical distraction Thoracic ext mobs Trigger Point Dry-Needling performed     by Garen Lah Treatment instructions: Expect mild to moderate muscle soreness. S/S of pneumothorax if dry needled over a lung field, and to seek immediate medical attention should they occur. Patient verbalized understanding of these instructions and education.  Patient Consent Given: Yes Education handout provided: Previously provided Muscles treated: UT/LS right. Subscapularis R. R rhomboid and ribs 5,6.7 Electrical stimulation performed: Yes Parameters: yes  25 pps  to pt tolerance and raised as pt able every 2 minutes for 11 min Treatment response/outcome: twitch response noted, pt noted relief Moist Hot Pack-  to thoracic and cervical  [facility] Adult PT Treatment:                                                DATE: 02-11-23 Therapeutic Exercise: On Pink foam roller,  supine scapular start pattern with GTB 25 # lift training bar  OH press standing 1 x 8 then with legs staggered 2 x 7 Manual Therapy: STW  over needled muscle Gentle cervical distraction Thoracic ext mobs UPA on R of C- 3 to C-6 Trigger Point  Dry-Needling performed     by Garen Lah Treatment instructions: Expect mild to moderate muscle soreness. S/S of pneumothorax if dry needled over a lung field, and to seek immediate medical attention should they occur. Patient verbalized understanding of these instructions and education.  Patient Consent Given: Yes Education handout provided: Previously provided Muscles treated: UT/LS right. Subscapularis R. R rhomboid and ribs 5,6.7 Electrical stimulation performed: No Parameters: N/A Treatment response/outcome: twitch response noted, pt noted relief  Moist hot pack Self Care: Pt given handouts and discussed posture and body mechanics and answered all questions Lifting principles  DATE: eval  and issue HEP Trigger Point Dry-Needling performed     by Garen Lah Treatment instructions: Expect mild to moderate muscle soreness. S/S of pneumothorax if dry needled over a lung field, and to seek immediate medical attention should they occur. Patient verbalized understanding of these instructions and education.  Patient Consent Given: Yes Education handout provided: Previously provided Muscles treated: UT/LS right Electrical stimulation performed: No Parameters: N/A Treatment response/outcome: twitch response noted, pt noted relief   PATIENT EDUCATION: Education details: POC, Explanation of findings  FOTO report and issue of HEP, TPDN education and handouts Person educated: Patient Education method: Explanation, Demonstration, Tactile cues, Verbal cues, and Handouts Education comprehension: verbalized understanding, returned demonstration, verbal cues required, tactile cues required, and needs further education  HOME EXERCISE PROGRAM: Access Code: [email or web address] URL: [email or web address] Date: 02/04/2023 Prepared by: Garen Lah  Program Notes Use towel over R upper trap for added stability with stretch  Exercises - Seated Gentle Upper Trapezius Stretch  - 2 x daily - 7 x weekly - 1 sets - 3 reps - 20-20 sec hold - Gentle Levator Scapulae Stretch  - 2 x daily - 7 x weekly - 1 sets - 3 reps - 20-30 hold - Shoulder External Rotation and Scapular Retraction with Resistance  - 1-2 x daily - 7 x weekly - 3 sets - 10 reps - 3-5 sec hold - Shoulder extension with resistance - Neutral  - 1 x daily - 7 x weekly - 3 sets - 10 reps - Standing Shoulder Horizontal Abduction with Resistance  - 1 x daily - 7 x weekly - 3 sets - 10 reps - Standing Shoulder Diagonal Horizontal Abduction 60/120 Degrees with Resistance  - 1 x daily - 7 x weekly - 3 sets - 10 reps Added 03-18-23 - Prone Shoulder flexion/Scaption on Swiss ball with Dumbbell  - 1 x daily - 7 x weekly - 3 sets - 10 reps - Prone Middle Trapezius Strengthening on Swiss Ball with Dumbbells  - 1 x daily - 7 x weekly - 3 sets - 10 reps - Prone Shoulder Extension on Swiss Ball with Dumbbells  - 1 x daily - 7 x weekly - 3 sets - 10 reps - Prone Shoulder W on [employer]  - 1 x daily - 7 x weekly - 3 sets - 10 reps  ASSESSMENT:  CLINICAL IMPRESSION: Evelyn Castro enters clinic with 0/10 and still complains of morning discomfort but does not wake at night. Pt is able to lift 20 lb to shelf overhead.  Pt given  information about community resources.   Pt stated she was able to roll over on R shoulder without waking and feels more improvement.  Pt achieved all LTG.  Pt reports good compliance with HEP at home and is mostly compliant with HEP. FOTO improved to 78%.  Pt  consents to TPDN today and is closely monitored throughout session. With e stim. Pt is  ready for DC. Pt was a joy for whom to serve and will do excellently after DC    EVAL-  Patient is a 57 y.o. female who was seen today for physical therapy evaluation and treatment for chronic R shld pain from an injury pulling ivy  at her parents home 2 years ago. She has had ongoing discomfort but notices more pain now when she exerts force through her arm while washing her car or washing windows  She also notes pain when she drives for longer than 2 hours in  car.  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 Pt is proactive with health and walks and belongs to a gym. She would like to alleviate pain so she can continue to improve her overall health and strength.  Pt  will benefit from skilled PT to maximize her AROM/strength and eliminate her pain for functional activities  OBJECTIVE IMPAIRMENTS: decreased activity tolerance, decreased strength, impaired UE functional use, improper body mechanics, postural dysfunction, and pain.   ACTIVITY LIMITATIONS: reach over head and washing car/windows/yardwork  PARTICIPATION LIMITATIONS: driving and community activity  PERSONAL FACTORS: 1 comorbidity: Breast CA in remission  are also affecting patient's functional outcome.   REHAB POTENTIAL: Excellent  CLINICAL DECISION MAKING: Evolving/moderate complexity  EVALUATION COMPLEXITY: Moderate   GOALS: Goals reviewed with patient? Yes  SHORT TERM GOALS: Target date: 02-06-23  Pt will be I with initial HEP specific impairment Baseline: routinely goes to [employer] Goal status: MET  2.  When utilizing R UE pain will decrease to at least 5/10  Baseline: 7/10 02-06-23 3-4/10 Goal status: MET  3.  Demonstrate understanding of proper sitting posture and be more conscious of position and posture throughout the day.  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  Office ergonomic education to prevent flexed posture on computer Baseline: works as bookkeeper 02-11-23  given handout Goal status: MET    LONG TERM GOALS: Target date: 02-27-23  Revised 03-25-23  Pt will be I with  advanced HEP Baseline: limited knowledge Goal status:MET  2.  Pt will be able to wash car or windows without exacerbating pain greater than 2/10 Baseline: Pt  7/10  02-25-23  3/10 03-18-23  3/10 in morning down  to 0/10 at times 03-20-23  only discomfort but no real pain Goal status: MET  3.  Pt will be able to drive and scan environment without exacerbating pain greater 2/10 Baseline: increases with 2 hours driving 10-08-79  short term driving good but longer trips greater than 2 hours difficult 03-18-23   Pain in morning but not driving. Able to drive to [facility]  3 hours Goal status:MET  4.  FOTO will improve from  57%  to  70%   indicating improved functional mobility.  Baseline: 57% eval 02-25-23  64% 03-20-23  78% Goal status:MET  5.  Pt will demonstrate increased UE strength by placing 20 lb weight above head without compensations Baseline: able to lift 15 lb but with compensation and pain 02-25-23 able to lift above head but compensations to place on shelf 03-20-23  able to lift 20 lb above head to shelf  Goal status: MET  6.  Pt will be able to sleep on Left side and not wake due to pain for 5 or more restorative hours of sleep at night Baseline: avoids sleeping on Right side due to pain 02-25-23 Rarely will wake up but wakes up with shoulder sore 03-20-23  Pt able to sleep well but notices soreness in morning but does not wake at night Goal status: MET  PLAN:  PT FREQUENCY: 1-2x/week extended 1 x a week   PT DURATION: 6 weeks extended 4 weeks to complete RX  PLANNED INTERVENTIONS: Therapeutic exercises, Therapeutic activity, Neuromuscular re-education, Balance training, Gait training, Patient/Family education, Self Care, Joint mobilization, Dry Needling, Electrical stimulation, Spinal mobilization, Cryotherapy, Moist heat, Taping, Ionotophoresis 4mg /ml Dexamethasone, Manual therapy,  and Re-evaluation  PLAN FOR NEXT SESSION: HEP and TPDN/manual  Garen Lah, PT,  ATRIC Certified Exercise Expert for the Aging Adult  03/20/23 1:24 PM Phone: [phone] Fax: [phone]   Garen Lah, PT, ATRIC Certified Exercise Expert for the Aging Adult  03/20/23 1:25 PM Phone: [phone] Fax: [phone]

## 2023-03-20 ENCOUNTER — Encounter: Payer: Self-pay | Admitting: Physical Therapy

## 2023-03-20 ENCOUNTER — Ambulatory Visit: Payer: 59 | Admitting: Physical Therapy

## 2023-03-20 DIAGNOSIS — M25511 Pain in right shoulder: Secondary | ICD-10-CM | POA: Diagnosis not present

## 2023-03-20 DIAGNOSIS — M542 Cervicalgia: Secondary | ICD-10-CM

## 2023-03-20 DIAGNOSIS — M6281 Muscle weakness (generalized): Secondary | ICD-10-CM

## 2023-03-20 DIAGNOSIS — G8929 Other chronic pain: Secondary | ICD-10-CM

## 2023-03-20 DIAGNOSIS — R293 Abnormal posture: Secondary | ICD-10-CM

## 2023-04-16 ENCOUNTER — Encounter: Payer: Self-pay | Admitting: Obstetrics and Gynecology

## 2023-04-30 ENCOUNTER — Inpatient Hospital Stay: Payer: 59 | Attending: Hematology and Oncology | Admitting: Hematology and Oncology

## 2023-04-30 VITALS — BP 155/86 | HR 95 | Temp 97.3°F | Resp 18 | Ht 59.0 in | Wt 141.9 lb

## 2023-04-30 DIAGNOSIS — D0501 Lobular carcinoma in situ of right breast: Secondary | ICD-10-CM | POA: Insufficient documentation

## 2023-04-30 DIAGNOSIS — Z79810 Long term (current) use of selective estrogen receptor modulators (SERMs): Secondary | ICD-10-CM | POA: Insufficient documentation

## 2023-04-30 NOTE — Assessment & Plan Note (Addendum)
LCIS right breast: Status post lumpectomy now on breast cancer prevention with tamoxifen 20 mg daily started September 2014 completed October 2019   Breast Cancer Surveillance: 1. Breast exam 04/30/2023: Benign 2. breast MRI 09/23/2022: Benign. Postsurgical changes. Breast Density Category C. Will get an MRI in 2024 3.  Mammogram 04/14/2023 at [facility]: Benign breast density category B   Evelyn Castro risk assessment: Patient's lifetime risk of breast cancer came at 65%.  10-year risk came back as 22%.   She is now married and her boyfriend who used to run a dive shop in [place] is looking for some other opportunities locally.   We discussed about doing contrast enhanced mammograms instead of mammogram alternating with breast MRI.  We will set this up at [facility] for next year. Return to clinic in 1 year for follow-up

## 2023-04-30 NOTE — Progress Notes (Signed)
Patient Care Team: Pcp, No as PCP - General  DIAGNOSIS:  Encounter Diagnosis  Name Primary?   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 She reports no breast pain or discomfort. She has been compliant with her annual mammograms and MRIs, which have all been normal. The patient has been taking tamoxifen for the past five years without any reported side effects. She has not noticed any new or concerning breast symptoms. The patient also reports a recent marriage and has not been traveling recently due to family health issues.       ALLERGIES:  has No Known Allergies.  MEDICATIONS:  Current Outpatient Medications  Medication Sig Dispense Refill   Cholecalciferol (VITAMIN D PO) Take by mouth.     diclofenac Sodium (VOLTAREN) 1 % GEL Apply topically as needed.     DiphenhydrAMINE HCl (BENADRYL PO) Take 25 mg by mouth daily as needed. For allergy symptoms      ibuprofen (ADVIL,MOTRIN) 200 MG tablet Take 200 mg by mouth 4 (four) times daily as needed. For menstrual cramps     Multiple Vitamin (MULTIVITAMIN PO) Take by mouth.     Pseudoephedrine HCl (SUDAFED 12 HOUR PO) Take by mouth.     No current facility-administered medications for this visit.    PHYSICAL EXAMINATION: ECOG PERFORMANCE STATUS: 1 - Symptomatic but completely ambulatory  Vitals:   04/30/23 0810  BP: (!) 155/86  Pulse: 95  Resp: 18  Temp: (!) 97.3 F (36.3 C)  SpO2: 100%   Filed Weights   04/30/23 0810  Weight: 141 lb 14.4 oz (64.4 kg)      LABORATORY DATA:  I have reviewed the data as listed    Latest Ref Rng & Units 05/09/2021    8:37 AM 04/28/2020    8:57 AM 04/07/2019    8:59 AM  CMP  Glucose 65 - 99 mg/dL 96  93  960   BUN 7 - 25 mg/dL 14  13  12    Creatinine 0.50 - 1.03 mg/dL 4.54  0.98  1.19   Sodium 135 - 146 mmol/L  140  138  137   Potassium 3.5 - 5.3 mmol/L 4.6  4.6  4.2   Chloride 98 - 110 mmol/L 104  104  105   CO2 20 - 32 mmol/L 23  22  24    Calcium 8.6 - 10.4 mg/dL 9.5  9.4  9.0   Total Protein 6.1 - 8.1 g/dL 7.2  6.9  7.0   Total Bilirubin 0.2 - 1.2 mg/dL 0.4  0.4  0.5   AST 10 - 35 U/L 16  15  18    ALT 6 - 29 U/L 15  11  12      Lab Results  Component Value Date   WBC 3.8 05/09/2021   HGB 14.2 05/09/2021   HCT 41.9 05/09/2021   MCV 89.0 05/09/2021   PLT 247 05/09/2021   NEUTROABS 3,325 04/07/2019    ASSESSMENT & PLAN:  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 Breast exam 04/30/2023: Benign 2. breast MRI 09/23/2022: Benign. Postsurgical changes. Breast Density Category C. Will get an MRI in 2024 3.  Mammogram 04/14/2023 at [facility]: Benign breast density category B   Joselyn Glassman Cusick risk assessment: Patient's lifetime risk of breast cancer came at 65%.  10-year risk came back as 22%.   She is now married and her boyfriend who used to run a dive shop in [place] is looking for some other opportunities locally.   We discussed about doing contrast enhanced mammograms instead of mammogram alternating with breast MRI.  We will set this up at [facility] for next year. Return to clinic in 1 year for follow-up    Orders Placed This Encounter  Procedures   MM Digital Screening    Standing Status:   Future    Standing Expiration Date:   04/29/2024    Scheduling Instructions:     Contrast enhanced mammograms    Order Specific Question:   Reason for Exam (SYMPTOM  OR DIAGNOSIS REQUIRED)    Answer:   Contrast enhanced mammogram for high risk breast cancer screening    Order Specific Question:   Is the patient pregnant?    Answer:   No    Order Specific Question:   Preferred imaging location?    Answer:   External     Comments:   [facility]   The patient has a good understanding of the overall plan. she agrees with it. she will call with any problems that may develop before the next visit here. Total time spent: 30 mins including face to face time and time spent for planning, charting and co-ordination of care   Tamsen Meek, MD 04/30/23

## 2023-09-08 ENCOUNTER — Ambulatory Visit: Payer: 59 | Admitting: Obstetrics and Gynecology

## 2023-09-25 ENCOUNTER — Ambulatory Visit (INDEPENDENT_AMBULATORY_CARE_PROVIDER_SITE_OTHER): Payer: 59 | Admitting: Obstetrics and Gynecology

## 2023-09-25 ENCOUNTER — Other Ambulatory Visit (HOSPITAL_COMMUNITY)
Admission: RE | Admit: 2023-09-25 | Discharge: 2023-09-25 | Disposition: A | Discharge status: Home / Self Care | Source: Ambulatory Visit | Attending: Obstetrics and Gynecology | Admitting: Obstetrics and Gynecology

## 2023-09-25 ENCOUNTER — Encounter: Payer: Self-pay | Admitting: Obstetrics and Gynecology

## 2023-09-25 VITALS — BP 110/78 | HR 94 | Ht 59.75 in | Wt 145.0 lb

## 2023-09-25 DIAGNOSIS — Z01419 Encounter for gynecological examination (general) (routine) without abnormal findings: Secondary | ICD-10-CM | POA: Insufficient documentation

## 2023-09-25 DIAGNOSIS — E2839 Other primary ovarian failure: Secondary | ICD-10-CM | POA: Diagnosis not present

## 2023-09-25 DIAGNOSIS — Z1231 Encounter for screening mammogram for malignant neoplasm of breast: Secondary | ICD-10-CM

## 2023-09-25 DIAGNOSIS — Z1331 Encounter for screening for depression: Secondary | ICD-10-CM

## 2023-09-25 NOTE — Progress Notes (Signed)
 58 y.o. y.o. female here for annual exam. Patient's last menstrual period was 02/17/2011.    G0 Married   RP:  Established patient presenting for annual gyn exam    HPI: Postmenopausal. Prior TAH for leiomyoma and endometriosis in 2012 ovaries intact. No significant hot flashes or night sweats. No vaginal bleeding.  Pap Neg 05/2021.  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 Pap reflex today.  Breasts normal.  History of right breast cancer.  Mammogram 04/14/23 Neg.  Followed by Dr. Pamelia Hoit. Colonoscopy 03/2018, repeat at 9 yrs.  BMI 26.98.  Will increase fitness activities and lower calorie/carb diet. Fasting health labs here today. Encouraged calcium and Vit D. To get baseline dxa Body mass index is 28.56 kg/m.     09/25/2023    8:00 AM  Depression screen PHQ 2/9  Decreased Interest 0  Down, Depressed, Hopeless 0  PHQ - 2 Score 0    Height 4' 11.75" (1.518 m), weight 145 lb (65.8 kg), last menstrual period 02/17/2011.     Component Value Date/Time   DIAGPAP  08/30/2022 0836    - Negative for Intraepithelial Lesions or Malignancy (NILM)   DIAGPAP - Benign reactive/reparative changes 08/30/2022 0836   DIAGPAP  05/09/2021 0849    - Negative for intraepithelial lesion or malignancy (NILM)   HPVHIGH Negative 08/30/2022 0836   ADEQPAP Satisfactory for evaluation. 08/30/2022 0836   ADEQPAP Satisfactory for evaluation. 05/09/2021 0849    GYN HISTORY:    Component Value Date/Time   DIAGPAP  08/30/2022 0836    - Negative for Intraepithelial Lesions or Malignancy (NILM)   DIAGPAP - Benign reactive/reparative changes 08/30/2022 0836   DIAGPAP  05/09/2021 0849    - Negative for intraepithelial lesion or malignancy (NILM)   HPVHIGH Negative 08/30/2022 0836   ADEQPAP Satisfactory for evaluation. 08/30/2022 0836   ADEQPAP Satisfactory for evaluation. 05/09/2021 0849    OB History  Gravida Para Term Preterm AB Living  0 0 0 0 0 0  SAB IAB Ectopic Multiple Live Births  0 0 0 0 0     Past Medical History:  Diagnosis Date   AGUS favor benign 12/2015   Review by outside pathologist feel glandular cells were benign with no atypia. Colposcopy was negative. Recommended follow up Pap smear one year.   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    Multiple allergies    Varicose veins     Past Surgical History:  Procedure Laterality Date   ABDOMINAL HYSTERECTOMY  02/25/2011   Procedure: HYSTERECTOMY ABDOMINAL;  Surgeon: Trellis Paganini, MD;  Location: [facility];  Service: Gynecology;  Laterality: N/A;   BREAST BIOPSY Right 11/12/2012   Procedure: RIGHT BREAST WIRE LOCALIZED EXCISION ;  Surgeon: Emelia Loron, MD;  Location: [facility];  Service: General;  Laterality: Right;   BREAST SURGERY     Lumpectomy   VARICOSE VEINS  2000, 2001    Current Outpatient Medications on File Prior to Visit  Medication Sig Dispense Refill   Cholecalciferol (VITAMIN D PO) Take by mouth.     diclofenac Sodium (VOLTAREN) 1 % GEL Apply topically as needed.     DiphenhydrAMINE HCl (BENADRYL PO) Take 25 mg by mouth daily as needed. For allergy symptoms      ibuprofen (ADVIL,MOTRIN) 200 MG tablet Take 200 mg by mouth 4 (four) times daily as needed. For menstrual cramps     Multiple Vitamin (MULTIVITAMIN PO) Take  by mouth.     Pseudoephedrine HCl (SUDAFED 12 HOUR PO) Take by mouth. (Patient not taking: Reported on 09/25/2023)     No current facility-administered medications on file prior to visit.    Social History   Socioeconomic History   Marital status: Married    Spouse name: Not on file   Number of children: Not on file   Years of education: Not on file   Highest education level: Not on file  Occupational History   Not on file  Tobacco Use   Smoking status: Never   Smokeless tobacco: Never  Vaping Use   Vaping status: Never Used  Substance and Sexual Activity   Alcohol use: Yes    Alcohol/week: 10.0 standard  drinks of alcohol    Types: 10 Standard drinks or equivalent per week   Drug use: No   Sexual activity: Yes    Partners: Male    Birth control/protection: Surgical    Comment: hysterectomy  Other Topics Concern   Not on file  Social History Narrative   Not on file   Social Drivers of Health   Financial Resource Strain: Not on file  Food Insecurity: Not on file  Transportation Needs: Not on file  Physical Activity: Not on file  Stress: Not on file  Social Connections: Not on file  Intimate Partner Violence: Not on file    Family History  Problem Relation Age of Onset   Hypertension Mother    Arthritis Mother    Hypertension Father    Uterine cancer Maternal Grandmother    Breast cancer Maternal Aunt        age 17     No Known Allergies    Patient's last menstrual period was Patient's last menstrual period was 02/17/2011.Marland Kitchen            Review of Systems Alls systems reviewed and are negative.     Physical Exam Constitutional:      Appearance: Normal appearance.  Genitourinary:     Vulva normal.     No lesions in the vagina.     Right Labia: No rash, lesions or skin changes.    Left Labia: No lesions, skin changes or rash.    Vaginal cuff intact.    No vaginal discharge or tenderness.     No vaginal prolapse present.    Mild vaginal atrophy present.     Right Adnexa: not tender and no mass present.    Left Adnexa: not tender and no mass present.    Cervix is absent.     Uterus is absent.  Breasts:    Right: Normal.     Left: Normal.  HENT:     Head: Normocephalic.  Neck:     Thyroid: No thyroid mass, thyromegaly or thyroid tenderness.  Cardiovascular:     Rate and Rhythm: Normal rate and regular rhythm.     Heart sounds: Normal heart sounds, S1 normal and S2 normal.  Pulmonary:     Effort: Pulmonary effort is normal.     Breath sounds: Normal breath sounds and air entry.  Abdominal:     General: There is no distension.     Palpations: Abdomen is  soft. There is no mass.     Tenderness: There is no abdominal tenderness. There is no guarding or rebound.  Musculoskeletal:        General: Normal range of motion.     Cervical back: Full passive range of motion without pain, normal range of  motion and neck supple. No tenderness.     Right lower leg: No edema.     Left lower leg: No edema.  Neurological:     Mental Status: She is alert.  Skin:    General: Skin is warm.  Psychiatric:        Mood and Affect: Mood normal.        Behavior: Behavior normal.        Thought Content: Thought content normal.  Vitals and nursing note reviewed. Exam conducted with a chaperone present.       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  Earley Favor

## 2023-09-26 ENCOUNTER — Encounter: Payer: Self-pay | Admitting: Obstetrics and Gynecology

## 2023-09-26 LAB — COMPREHENSIVE METABOLIC PANEL WITH GFR
AG Ratio: 1.6 (calc) (ref 1.0–2.5)
ALT: 16 U/L (ref 6–29)
AST: 16 U/L (ref 10–35)
Albumin: 4.4 g/dL (ref 3.6–5.1)
Alkaline phosphatase (APISO): 86 U/L (ref 37–153)
BUN: 15 mg/dL (ref 7–25)
CO2: 24 mmol/L (ref 20–32)
Calcium: 9.3 mg/dL (ref 8.6–10.4)
Chloride: 103 mmol/L (ref 98–110)
Creat: 0.77 mg/dL (ref 0.50–1.03)
Globulin: 2.7 g/dL (ref 1.9–3.7)
Glucose, Bld: 97 mg/dL (ref 65–99)
Potassium: 4.1 mmol/L (ref 3.5–5.3)
Sodium: 140 mmol/L (ref 135–146)
Total Bilirubin: 0.5 mg/dL (ref 0.2–1.2)
Total Protein: 7.1 g/dL (ref 6.1–8.1)
eGFR: 89 mL/min/{1.73_m2} (ref >=60)

## 2023-09-26 LAB — LIPID PANEL
Cholesterol: 214 mg/dL — ABNORMAL HIGH (ref <200)
HDL: 79 mg/dL (ref >=50)
LDL Cholesterol (Calc): 114 mg/dL — ABNORMAL HIGH
Non-HDL Cholesterol (Calc): 135 mg/dL — ABNORMAL HIGH (ref <130)
Total CHOL/HDL Ratio: 2.7 (calc) (ref <5.0)
Triglycerides: 106 mg/dL (ref <150)

## 2023-09-26 LAB — CBC
HCT: 42.5 % (ref 35.0–45.0)
Hemoglobin: 14.3 g/dL (ref 11.7–15.5)
MCH: 29.7 pg (ref 27.0–33.0)
MCHC: 33.6 g/dL (ref 32.0–36.0)
MCV: 88.4 fL (ref 80.0–100.0)
MPV: 9.4 fL (ref 7.5–12.5)
Platelets: 260 10*3/uL (ref 140–400)
RBC: 4.81 10*6/uL (ref 3.80–5.10)
RDW: 12.8 % (ref 11.0–15.0)
WBC: 4.1 10*3/uL (ref 3.8–10.8)

## 2023-09-26 LAB — HEMOGLOBIN A1C
Hgb A1c MFr Bld: 5.8 %{Hb} — ABNORMAL HIGH (ref <5.7)
Mean Plasma Glucose: 120 mg/dL
eAG (mmol/L): 6.6 mmol/L

## 2023-09-26 LAB — TSH: TSH: 2.26 m[IU]/L (ref 0.40–4.50)

## 2023-09-26 LAB — VITAMIN D 25 HYDROXY (VIT D DEFICIENCY, FRACTURES): Vit D, 25-Hydroxy: 48 ng/mL (ref 30–100)

## 2023-09-30 LAB — CYTOLOGY - PAP: Diagnosis: NEGATIVE

## 2023-10-27 ENCOUNTER — Encounter: Payer: Self-pay | Admitting: Obstetrics and Gynecology

## 2023-10-27 ENCOUNTER — Ambulatory Visit (HOSPITAL_BASED_OUTPATIENT_CLINIC_OR_DEPARTMENT_OTHER)
Admission: RE | Admit: 2023-10-27 | Discharge: 2023-10-27 | Disposition: A | Discharge status: Home / Self Care | Source: Ambulatory Visit | Attending: Obstetrics and Gynecology | Admitting: Obstetrics and Gynecology

## 2023-10-27 DIAGNOSIS — Z01419 Encounter for gynecological examination (general) (routine) without abnormal findings: Secondary | ICD-10-CM | POA: Insufficient documentation

## 2023-10-27 DIAGNOSIS — E2839 Other primary ovarian failure: Secondary | ICD-10-CM | POA: Diagnosis present

## 2024-04-14 LAB — HM MAMMOGRAPHY

## 2024-04-15 ENCOUNTER — Encounter: Payer: Self-pay | Admitting: Obstetrics and Gynecology

## 2024-04-15 ENCOUNTER — Ambulatory Visit: Payer: Self-pay | Admitting: Obstetrics and Gynecology

## 2024-05-03 ENCOUNTER — Other Ambulatory Visit: Payer: Self-pay

## 2024-05-03 ENCOUNTER — Inpatient Hospital Stay: Payer: 59 | Attending: Hematology and Oncology | Admitting: Hematology and Oncology

## 2024-05-03 VITALS — BP 144/60 | HR 95 | Temp 98.4°F | Resp 18 | Ht 60.0 in | Wt 142.0 lb

## 2024-05-03 DIAGNOSIS — Z79810 Long term (current) use of selective estrogen receptor modulators (SERMs): Secondary | ICD-10-CM | POA: Insufficient documentation

## 2024-05-03 DIAGNOSIS — D0501 Lobular carcinoma in situ of right breast: Secondary | ICD-10-CM | POA: Insufficient documentation

## 2024-05-03 NOTE — Assessment & Plan Note (Signed)
 LCIS right breast: Status post lumpectomy now on breast cancer prevention with tamoxifen  20 mg daily started September 2014 completed October 2019   Breast Cancer Surveillance: 1. Breast exam 05/03/2024: Benign 2. breast MRI 09/23/2022: Benign. Postsurgical changes. Breast Density Category C. Will get an MRI in 2024 3.  Mammogram 04/14/2024 at [facility]: Benign breast density category C   She is now married and her boyfriend who used to run a dive shop in [place] is looking for some other opportunities locally.   We are continuing to do contrast-enhanced mammograms at [facility].  Therefore we are not doing breast MRIs. Return to clinic in 1 year for follow-up

## 2024-05-03 NOTE — Progress Notes (Signed)
 Called [facility] to find out which COVID vaccine order to place. Pharmacist adivsed ou no longer need an RX to receive the vaccine. Called pt to let her know. LVM

## 2024-05-03 NOTE — Progress Notes (Signed)
 Patient Care Team: Pcp, No as PCP - General  DIAGNOSIS:  Encounter Diagnosis  Name Primary?   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  She mistakenly underwent a regular mammogram instead of the planned contrast-enhanced mammogram. The mammogram indicates dense breast tissue, categorized as either C or D. She experiences no breast pain or discomfort.     ALLERGIES:  has no known allergies.  MEDICATIONS:  Current Outpatient Medications  Medication Sig Dispense Refill   Cholecalciferol (VITAMIN D  PO) Take by mouth.     diclofenac Sodium (VOLTAREN) 1 % GEL Apply topically as needed.     DiphenhydrAMINE  HCl (BENADRYL  PO) Take 25 mg by mouth daily as needed. For allergy symptoms      ibuprofen  (ADVIL ,MOTRIN ) 200 MG tablet Take 200 mg by mouth 4 (four) times daily as needed. For menstrual cramps     Multiple Vitamin (MULTIVITAMIN PO) Take by mouth.     Pseudoephedrine HCl (SUDAFED 12 HOUR PO) Take by mouth. (Patient not taking: Reported on 05/03/2024)     No current facility-administered medications for this visit.    PHYSICAL EXAMINATION: ECOG PERFORMANCE STATUS: 1 - Symptomatic but completely ambulatory  Vitals:   05/03/24 0827  BP: (!) 144/60  Pulse: 95  Resp: 18  Temp: 98.4 F (36.9 C)  SpO2: 100%   Filed Weights   05/03/24 0827  Weight: 142 lb (64.4 kg)    Physical Exam   (exam performed in the presence of a chaperone)  LABORATORY DATA:  I have reviewed the data as listed    Latest Ref Rng & Units 09/25/2023    8:26 AM 05/09/2021    8:37 AM 04/28/2020    8:57 AM  CMP  Glucose 65 - 99 mg/dL 97  96  93   BUN 7 - 25 mg/dL 15  14  13    Creatinine 0.50 - 1.03 mg/dL 9.22  9.30  9.21   Sodium 135 - 146 mmol/L 140  140  138    Potassium 3.5 - 5.3 mmol/L 4.1  4.6  4.6   Chloride 98 - 110 mmol/L 103  104  104   CO2 20 - 32 mmol/L 24  23  22    Calcium 8.6 - 10.4 mg/dL 9.3  9.5  9.4   Total Protein 6.1 - 8.1 g/dL 7.1  7.2  6.9   Total Bilirubin 0.2 - 1.2 mg/dL 0.5  0.4  0.4   AST 10 - 35 U/L 16  16  15    ALT 6 - 29 U/L 16  15  11      Lab Results  Component Value Date   WBC 4.1 09/25/2023   HGB 14.3 09/25/2023   HCT 42.5 09/25/2023   MCV 88.4 09/25/2023   PLT 260 09/25/2023   NEUTROABS 3,325 04/07/2019    ASSESSMENT & PLAN:  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 Breast exam 05/03/2024: Benign 2. breast MRI 09/23/2022: Benign. Postsurgical changes. Breast Density Category C. 3.  Mammogram 04/14/2024 at [facility]: Benign breast density category C   She is now married and her husband who  used to run a dive shop in [place] is looking to be a medical interpreter   We are continuing to do contrast-enhanced mammograms at [facility].  Therefore we are not doing breast MRIs. Return to clinic in 1 year for follow-up   No orders of the defined types were placed in this encounter.  The patient has a good understanding of the overall plan. she agrees with it. she will call with any problems that may develop before the next visit here.  I personally spent a total of 30 minutes in the care of the patient today including preparing to see the patient, getting/reviewing separately obtained history, performing a medically appropriate exam/evaluation, counseling and educating, placing orders, referring and communicating with other health care professionals, documenting clinical information in the EHR, independently interpreting results, communicating results, and coordinating care.   Viinay K Kayani Rapaport, MD 05/03/24

## 2024-09-30 ENCOUNTER — Ambulatory Visit: Admitting: Obstetrics and Gynecology

## 2024-10-06 ENCOUNTER — Ambulatory Visit: Admitting: Obstetrics and Gynecology

## 2024-10-20 ENCOUNTER — Ambulatory Visit: Admitting: Obstetrics and Gynecology

## 2025-05-12 ENCOUNTER — Inpatient Hospital Stay: Admitting: Hematology and Oncology
